# Patient Record
Sex: Male | Born: 1950 | Race: White | Hispanic: No | Smoking: Never smoker
Health system: Southern US, Community
[De-identification: ages and names within clinical notes are randomized; demographics above are authoritative.]

## PROBLEM LIST (undated history)

## (undated) DIAGNOSIS — M47812 Spondylosis without myelopathy or radiculopathy, cervical region: Secondary | ICD-10-CM

## (undated) DIAGNOSIS — N400 Enlarged prostate without lower urinary tract symptoms: Secondary | ICD-10-CM

## (undated) DIAGNOSIS — N529 Male erectile dysfunction, unspecified: Secondary | ICD-10-CM

## (undated) DIAGNOSIS — C801 Malignant (primary) neoplasm, unspecified: Secondary | ICD-10-CM

## (undated) HISTORY — PX: HERNIA REPAIR: SHX51

## (undated) HISTORY — PX: SKIN CANCER EXCISION: SHX779

## (undated) HISTORY — DX: Male erectile dysfunction, unspecified: N52.9

## (undated) HISTORY — DX: Spondylosis without myelopathy or radiculopathy, cervical region: M47.812

## (undated) HISTORY — DX: Benign prostatic hyperplasia without lower urinary tract symptoms: N40.0

---

## 2005-01-05 ENCOUNTER — Ambulatory Visit: Payer: Self-pay | Admitting: Gastroenterology

## 2005-01-05 LAB — HM COLONOSCOPY

## 2010-07-02 ENCOUNTER — Ambulatory Visit: Payer: Self-pay | Admitting: General Surgery

## 2010-07-07 ENCOUNTER — Ambulatory Visit: Payer: Self-pay | Admitting: General Surgery

## 2012-05-03 DIAGNOSIS — Z85828 Personal history of other malignant neoplasm of skin: Secondary | ICD-10-CM

## 2012-05-03 HISTORY — DX: Personal history of other malignant neoplasm of skin: Z85.828

## 2015-05-04 ENCOUNTER — Telehealth: Payer: Self-pay | Admitting: Family Medicine

## 2015-05-05 NOTE — Telephone Encounter (Signed)
Please let Aliou Yoon know that I'd like to see patient for an appointment here in the office for:  Follow-up He is is welcome to do the Welcome to Medicare visit (45 minutes) Please schedule a visit with me in the next: month Fasting?  Yes, please Thank you, Dr. Sanda Klein Rx being sent as requested

## 2015-05-06 ENCOUNTER — Telehealth: Payer: Self-pay | Admitting: Family Medicine

## 2015-05-06 NOTE — Telephone Encounter (Signed)
Patient said that per work he has to get his blood work done somewhere else, he needs a order to take or if Dr. Sanda Klein can send it to employee health at [facility] building. Patient said she has done it before, therefore he was not sure the exact information of the location.

## 2015-05-06 NOTE — Telephone Encounter (Signed)
I'll need to see him before ordering any labs please

## 2015-05-08 NOTE — Telephone Encounter (Signed)
Left vm on patient to call us back and make appointment and then you will send lab order to where he needs to do it.

## 2015-05-13 NOTE — Telephone Encounter (Signed)
Patient has an appt on 05/22/14 for his medicare well visit.

## 2015-05-20 DIAGNOSIS — N529 Male erectile dysfunction, unspecified: Secondary | ICD-10-CM | POA: Insufficient documentation

## 2015-05-20 DIAGNOSIS — N400 Enlarged prostate without lower urinary tract symptoms: Secondary | ICD-10-CM | POA: Insufficient documentation

## 2015-05-23 ENCOUNTER — Ambulatory Visit (INDEPENDENT_AMBULATORY_CARE_PROVIDER_SITE_OTHER): Payer: Managed Care, Other (non HMO) | Admitting: Family Medicine

## 2015-05-23 ENCOUNTER — Encounter: Payer: Self-pay | Admitting: Family Medicine

## 2015-05-23 VITALS — BP 113/72 | HR 58 | Temp 98.4°F | Ht 69.18 in | Wt 190.0 lb

## 2015-05-23 DIAGNOSIS — Z114 Encounter for screening for human immunodeficiency virus [HIV]: Secondary | ICD-10-CM

## 2015-05-23 DIAGNOSIS — Z0000 Encounter for general adult medical examination without abnormal findings: Secondary | ICD-10-CM | POA: Diagnosis not present

## 2015-05-23 DIAGNOSIS — Z1211 Encounter for screening for malignant neoplasm of colon: Secondary | ICD-10-CM | POA: Diagnosis not present

## 2015-05-23 DIAGNOSIS — Z1159 Encounter for screening for other viral diseases: Secondary | ICD-10-CM | POA: Diagnosis not present

## 2015-05-23 NOTE — Assessment & Plan Note (Signed)
One-time hep C screening test offered, ordered

## 2015-05-23 NOTE — Progress Notes (Signed)
Patient: Jeremiah Beck, Male    DOB: Jan 18, 1951, 65 y.o.   MRN: [id number]  Visit Date: 05/23/2015  Today's Provider: Enid Derry, MD   Chief Complaint  Patient presents with  . Welcome To Medicare    Subjective:   Syncere Katzenstein is a 65 y.o. male who presents today for his Subsequent Annual Wellness Visit.  Caregiver input:  No USPSTF grade A and B recommendations Alcohol: much less than 14/week, way less, almost nothing really Depression:  Depression screen [facility] 2/9 05/23/2015 05/23/2015  Decreased Interest 0 0  Down, Depressed, Hopeless 1 1  PHQ - 2 Score 1 1   Hypertension: excellent control Obesity: not obese Tobacco use: did not smoke much, less than 100 cigarettes HIV, hep B, hep C: agrees to hep C, no hep B, agrees to HIV STD testing and prevention (chl/gon/syphilis): asx Lipids: today Glucose: today Colorectal cancer: ordered Breast cancer: no lumps; no fam hx of breast cancer Lung cancer: n/a Osteoporosis: n/a AAA: less than 100 cigarettes Aspirin: taking 81 mg daily Diet: tries to eat fruits, try to eat healthy; encouraged healthy eating Exercise: he is active, working full-time, wants to be more active Skin cancer: sees dermatologist, goes yearly, Dr. Aubery Lapping  HPI  Review of Systems  Respiratory: Negative for shortness of breath.   Cardiovascular: Negative for chest pain.    Past Medical History  Diagnosis Date  . BPH (benign prostatic hypertrophy)   . ED (erectile dysfunction)    Past Surgical History  Procedure Laterality Date  . Hernia repair    . Skin cancer excision      removed from face   Family History  Problem Relation Age of Onset  . Cancer Father   . Alcohol abuse Father   . Varicose Veins Paternal Grandmother   . Cancer Mother   mother had bile duct cancer  Social History   Social History  . Marital Status: Divorced    Spouse Name: N/A  . Number of Children: N/A  . Years of Education: N/A   Occupational History  .  Not on file.   Social History Main Topics  . Smoking status: Never Smoker   . Smokeless tobacco: Never Used  . Alcohol Use: No  . Drug Use: No  . Sexual Activity: Not on file   Other Topics Concern  . Not on file   Social History Narrative    Outpatient Encounter Prescriptions as of 05/23/2015  Medication Sig  . aspirin EC 81 MG tablet Take 81 mg by mouth daily.  . finasteride (PROSCAR) 5 MG tablet take 1 tablet by mouth once daily   No facility-administered encounter medications on file as of 05/23/2015.   Functional Ability / Safety Screening 1.  Was the timed Get Up and Go test longer than 30 seconds?  no 2.  Does the patient need help with the phone, transportation, shopping,      preparing meals, housework, laundry, medications, or managing money?  no 3.  Does the patient's home have:  loose throw rugs in the hallway?   no      Grab bars in the bathroom? yes      Handrails on the stairs?   yes      Poor lighting?   no 4.  Has the patient noticed any hearing difficulties?   no  Fall Risk Assessment See under rooming  Depression Screen See under rooming Depression screen [facility] 2/9 05/23/2015 05/23/2015  Decreased Interest 0 0  Down, Depressed, Hopeless 1  1  PHQ - 2 Score 1 1   Advanced Directives Does patient have a HCPOA?    no Does patient have a living will or MOST form?  no  Objective:   Vitals: BP 113/72 mmHg  Pulse 58  Temp(Src) 98.4 F (36.9 C)  Ht 5' 9.17" (1.757 m)  Wt 190 lb (86.183 kg)  BMI 27.92 kg/m2  SpO2 94% Body mass index is 27.92 kg/(m^2).  Visual Acuity Screening   Right eye Left eye Both eyes  Without correction: 20/25 20/30 20/20   With correction:      Physical Exam  Constitutional: He appears well-developed and well-nourished. No distress.  HENT:  Head: Normocephalic and atraumatic.  Nose: Nose normal.  Mouth/Throat: Oropharynx is clear and moist.  Eyes: EOM are normal. No scleral icterus.  Neck: No JVD present. No thyromegaly  present.  Cardiovascular: Normal rate, regular rhythm and normal heart sounds.   Pulmonary/Chest: Effort normal and breath sounds normal. No respiratory distress. He has no wheezes. He has no rales.  Abdominal: Soft. Bowel sounds are normal. He exhibits no distension. There is no tenderness. There is no guarding.  Genitourinary: Rectal exam shows no mass, no tenderness and anal tone normal. Guaiac negative stool. Prostate is not enlarged and not tender.  Musculoskeletal: Normal range of motion. He exhibits no edema.  Lymphadenopathy:    He has no cervical adenopathy.  Neurological: He is alert. He displays normal reflexes. He exhibits normal muscle tone. Coordination normal.  Skin: Skin is warm and dry. No rash noted. He is not diaphoretic. No erythema. No pallor.  Psychiatric: He has a normal mood and affect. His behavior is normal. Judgment and thought content normal.   Mood/affect:  euthymic Appearance:  Casually dressed, [place] [employer] - 6[employer]  Correct? Score   What year is it? yes 0 Yes = 0    No = 4  What month is it? yes 0 Yes = 0    No = 3  Remember:     Pia [address]au, [place], [place]     What time is it? yes 0 Yes = 0    No = 3  Count backwards from 20 to 1 yes 0 Correct = 0    1 error = 2   More than 1 error = 4  Say the months of the year in reverse. yes 0 Correct = 0    1 error = 2   More than 1 error = 4  What address did I ask you to remember? yes 0 Correct = 0  1 error = 2    2 error = 4    3 error = 6    4 error = 8    All wrong = 10       TOTAL SCORE  0/28   Interpretation:  Normal  Normal (0-7) Abnormal (8-28)   Assessment & Plan:     Annual Wellness Visit  Reviewed patient's Family Medical History Reviewed and updated list of patient's medical providers Assessment of cognitive impairment was done Assessed patient's functional ability Established a written schedule for health screening [place]  Completed and Reviewed  Immunization History  Administered Date(s) Administered  . Td 01/01/2002, 05/03/2010   Health Maintenance  Topic Date Due  . Hepatitis C Screening  Jun 09, 1950  . HIV Screening  04/13/1965  . ZOSTAVAX  04/13/2010  . INFLUENZA VACCINE  12/02/2014  . COLONOSCOPY  01/06/2015  .  PNA vac Low Risk Adult (1 of 2 - PCV13) 04/14/2015  . TETANUS/TDAP  05/03/2020   Discussed health benefits of physical activity, and encouraged him to engage in regular exercise appropriate for his age and condition.    Current outpatient prescriptions:  .  aspirin EC 81 MG tablet, Take 81 mg by mouth daily., Disp: , Rfl:  .  finasteride (PROSCAR) 5 MG tablet, take 1 tablet by mouth once daily, Disp: 30 tablet, Rfl: 0 There are no discontinued medications.  Next Medicare Wellness Visit in 12+ months Orders Placed This Encounter  Procedures  . HIV antibody  . Hepatitis C antibody  . Lipid Panel w/o Chol/HDL Ratio  . Comprehensive metabolic panel  . PSA  . Ambulatory referral to Gastroenterology  . EKG 12-Lead  EKG: NSR, no abnormal ST-T wave changes, interpreted by MD  Problem List Items Addressed This Visit      Other   Preventative health care - Primary    USPSTF grade A and B recommendations reviewed with patient; age-appropriate recommendations, preventive care, screening tests, etc discussed and encouraged; healthy living encouraged; see AVS for patient education given to patient      Relevant Orders   Lipid Panel w/o Chol/HDL Ratio   Comprehensive metabolic panel   PSA   EKG 12-Lead (Completed)   Colon cancer screening    Refer to GI for screening colonoscopy      Relevant Orders   Ambulatory referral to Gastroenterology   Screening for HIV (human immunodeficiency virus)    One-time HIV screening test offered, ordered      Relevant Orders   HIV antibody   Need for hepatitis C screening test    One-time hep C screening test offered, ordered      Relevant  Orders   Hepatitis C antibody      He will have his labs done at outside facility; he was encouraged to call me if he has not heard back from his labs one week after having them drawn  An after-visit summary was printed and given to the patient at [facility].  Please see the patient instructions which may contain other information and recommendations beyond what is mentioned above in the assessment and plan.

## 2015-05-23 NOTE — Assessment & Plan Note (Signed)
One-time HIV screening test offered, ordered

## 2015-05-23 NOTE — Assessment & Plan Note (Signed)
USPSTF grade A and B recommendations reviewed with patient; age-appropriate recommendations, preventive care, screening tests, etc discussed and encouraged; healthy living encouraged; see AVS for patient education given to patient  

## 2015-05-23 NOTE — Patient Instructions (Addendum)
GINA--> please give living will, HCPOA paperwork We'll get you referred for a colonoscopy Please do have the labs done at your convenience Call if you have not heard from me one week after your labs drawn  Health Maintenance, Male A healthy lifestyle and preventative care can promote health and wellness.  Maintain regular health, dental, and eye exams.  Eat a healthy diet. Foods like vegetables, fruits, whole grains, low-fat dairy products, and lean protein foods contain the nutrients you need and are low in calories. Decrease your intake of foods high in solid fats, added sugars, and salt. Get information about a proper diet from your health care provider, if necessary.  Regular physical exercise is one of the most important things you can do for your health. Most adults should get at least 150 minutes of moderate-intensity exercise (any activity that increases your heart rate and causes you to sweat) each week. In addition, most adults need muscle-strengthening exercises on 2 or more days a week.   Maintain a healthy weight. The body mass index (BMI) is a screening tool to identify possible weight problems. It provides an estimate of body fat based on height and weight. Your health care provider can find your BMI and can help you achieve or maintain a healthy weight. For males 20 years and older:  A BMI below 18.5 is considered underweight.  A BMI of 18.5 to 24.9 is normal.  A BMI of 25 to 29.9 is considered overweight.  A BMI of 30 and above is considered obese.  Maintain normal blood lipids and cholesterol by exercising and minimizing your intake of saturated fat. Eat a balanced diet with plenty of fruits and vegetables. Blood tests for lipids and cholesterol should begin at age 39 and be repeated every 5 years. If your lipid or cholesterol levels are high, you are over age 40, or you are at high risk for heart disease, you may need your cholesterol levels checked more  frequently.Ongoing high lipid and cholesterol levels should be treated with medicines if diet and exercise are not working.  If you smoke, find out from your health care provider how to quit. If you do not use tobacco, do not start.  Lung cancer screening is recommended for adults aged 16-80 years who are at high risk for developing lung cancer because of a history of smoking. A yearly low-dose CT scan of the lungs is recommended for people who have at least a 30-pack-year history of smoking and are current smokers or have quit within the past 15 years. A pack year of smoking is smoking an average of 1 pack of cigarettes a day for 1 year (for example, a 30-pack-year history of smoking could mean smoking 1 pack a day for 30 years or 2 packs a day for 15 years). Yearly screening should continue until the smoker has stopped smoking for at least 15 years. Yearly screening should be stopped for people who develop a health problem that would prevent them from having lung cancer treatment.  If you choose to drink alcohol, do not have more than 2 drinks per day. One drink is considered to be 12 oz (360 mL) of beer, 5 oz (150 mL) of wine, or 1.5 oz (45 mL) of liquor.  Avoid the use of street drugs. Do not share needles with anyone. Ask for help if you need support or instructions about stopping the use of drugs.  High blood pressure causes heart disease and increases the risk of stroke. High  blood pressure is more likely to develop in:  People who have blood pressure in the end of the normal range (100-139/85-89 mm Hg).  People who are overweight or obese.  People who are African American.  If you are 28-7 years of age, have your blood pressure checked every 3-5 years. If you are 58 years of age or older, have your blood pressure checked every year. You should have your blood pressure measured twice--once when you are at a hospital or clinic, and once when you are not at a hospital or clinic. Record the  average of the two measurements. To check your blood pressure when you are not at a hospital or clinic, you can use:  An automated blood pressure machine at a pharmacy.  A home blood pressure monitor.  If you are 11-44 years old, ask your health care provider if you should take aspirin to prevent heart disease.  Diabetes screening involves taking a blood sample to check your fasting blood sugar level. This should be done once every 3 years after age 1 if you are at a normal weight and without risk factors for diabetes. Testing should be considered at a younger age or be carried out more frequently if you are overweight and have at least 1 risk factor for diabetes.  Colorectal cancer can be detected and often prevented. Most routine colorectal cancer screening begins at the age of 38 and continues through age 24. However, your health care provider may recommend screening at an earlier age if you have risk factors for colon cancer. On a yearly basis, your health care provider may provide home test kits to check for hidden blood in the stool. A small camera at the end of a tube may be used to directly examine the colon (sigmoidoscopy or colonoscopy) to detect the earliest forms of colorectal cancer. Talk to your health care provider about this at age 76 when routine screening begins. A direct exam of the colon should be repeated every 5-10 years through age 39, unless early forms of precancerous polyps or small growths are found.  People who are at an increased risk for hepatitis B should be screened for this virus. You are considered at high risk for hepatitis B if:  You were born in a country where hepatitis B occurs often. Talk with your health care provider about which countries are considered high risk.  Your parents were born in a high-risk country and you have not received a shot to protect against hepatitis B (hepatitis B vaccine).  You have HIV or AIDS.  You use needles to inject street  drugs.  You live with, or have sex with, someone who has hepatitis B.  You are a man who has sex with other men (MSM).  You get hemodialysis treatment.  You take certain medicines for conditions like cancer, organ transplantation, and autoimmune conditions.  Hepatitis C blood testing is recommended for all people born from [birth year] through 1966 and any individual with known risk factors for hepatitis C.  Healthy men should no longer receive prostate-specific antigen (PSA) blood tests as part of routine cancer screening. Talk to your health care provider about prostate cancer screening.  Testicular cancer screening is not recommended for adolescents or adult males who have no symptoms. Screening includes self-exam, a health care provider exam, and other screening tests. Consult with your health care provider about any symptoms you have or any concerns you have about testicular cancer.  Practice safe sex. Use condoms and  avoid high-risk sexual practices to reduce the spread of sexually transmitted infections (STIs).  You should be screened for STIs, including gonorrhea and chlamydia if:  You are sexually active and are younger than 24 years.  You are older than 24 years, and your health care provider tells you that you are at risk for this type of infection.  Your sexual activity has changed since you were last screened, and you are at an increased risk for chlamydia or gonorrhea. Ask your health care provider if you are at risk.  If you are at risk of being infected with HIV, it is recommended that you take a prescription medicine daily to prevent HIV infection. This is called pre-exposure prophylaxis (PrEP). You are considered at risk if:  You are a man who has sex with other men (MSM).  You are a heterosexual man who is sexually active with multiple partners.  You take drugs by injection.  You are sexually active with a partner who has HIV.  Talk with your health care provider about  whether you are at high risk of being infected with HIV. If you choose to begin PrEP, you should first be tested for HIV. You should then be tested every 3 months for as long as you are taking PrEP.  Use sunscreen. Apply sunscreen liberally and repeatedly throughout the day. You should seek shade when your shadow is shorter than you. Protect yourself by wearing long sleeves, pants, a wide-brimmed hat, and sunglasses year round whenever you are outdoors.  Tell your health care provider of new moles or changes in moles, especially if there is a change in shape or color. Also, tell your health care provider if a mole is larger than the size of a pencil eraser.  A one-time screening for abdominal aortic aneurysm (AAA) and surgical repair of large AAAs by ultrasound is recommended for men aged 63-75 years who are current or former smokers.  Stay current with your vaccines (immunizations).   This information is not intended to replace advice given to you by your health care provider. Make sure you discuss any questions you have with your health care provider.   Document Released: 10/16/2007 Document Revised: 05/10/2014 Document Reviewed: 09/14/2010 Elsevier Interactive Patient Education [employer].

## 2015-05-23 NOTE — Assessment & Plan Note (Signed)
Refer to GI for screening colonoscopy 

## 2015-06-07 ENCOUNTER — Other Ambulatory Visit: Payer: Self-pay | Admitting: Family Medicine

## 2015-06-08 NOTE — Telephone Encounter (Signed)
rx approved

## 2015-07-22 ENCOUNTER — Telehealth: Payer: Self-pay | Admitting: Family Medicine

## 2015-07-22 DIAGNOSIS — Z1211 Encounter for screening for malignant neoplasm of colon: Secondary | ICD-10-CM

## 2015-07-22 NOTE — Telephone Encounter (Signed)
Please call patient; I have not seen the results of his labs ordered in January I will only be here at [facility] another ten days If he can please get those drawn and sent over before I leave, that would be greatly appreciated Thank you

## 2015-07-23 NOTE — Assessment & Plan Note (Addendum)
Cologuard ordered at patient's request; he does not want colonoscopy

## 2015-07-23 NOTE — Telephone Encounter (Signed)
Reminded patient to have his labs drawn thru his work clinic asap. He'd also rather have the cologuard test done instead of the colonoscopy. Can you put in the order for that?

## 2015-07-23 NOTE — Telephone Encounter (Signed)
Thank you I put in the order for Cologuard Please have someone CANCEL the gastroenterology referral for colonoscopy

## 2015-07-23 NOTE — Telephone Encounter (Signed)
GI referral cancelled

## 2016-05-25 ENCOUNTER — Encounter: Payer: Managed Care, Other (non HMO) | Admitting: Family Medicine

## 2016-06-04 ENCOUNTER — Encounter: Payer: Managed Care, Other (non HMO) | Admitting: Family Medicine

## 2016-06-09 ENCOUNTER — Encounter: Payer: Managed Care, Other (non HMO) | Admitting: Family Medicine

## 2016-06-21 ENCOUNTER — Other Ambulatory Visit: Payer: Self-pay

## 2016-06-21 ENCOUNTER — Other Ambulatory Visit: Payer: Self-pay | Admitting: Family Medicine

## 2016-06-21 NOTE — Telephone Encounter (Signed)
Patient needs labs and an appt Never had the labs done that were ordered January 2017

## 2016-06-21 NOTE — Telephone Encounter (Signed)
Note is completely blank

## 2016-06-23 ENCOUNTER — Ambulatory Visit (INDEPENDENT_AMBULATORY_CARE_PROVIDER_SITE_OTHER): Payer: Managed Care, Other (non HMO) | Admitting: Family Medicine

## 2016-06-23 ENCOUNTER — Encounter: Payer: Self-pay | Admitting: Family Medicine

## 2016-06-23 VITALS — BP 114/70 | HR 70 | Temp 99.0°F | Resp 16 | Ht 69.5 in | Wt 190.0 lb

## 2016-06-23 DIAGNOSIS — Z114 Encounter for screening for human immunodeficiency virus [HIV]: Secondary | ICD-10-CM | POA: Diagnosis not present

## 2016-06-23 DIAGNOSIS — Z125 Encounter for screening for malignant neoplasm of prostate: Secondary | ICD-10-CM

## 2016-06-23 DIAGNOSIS — Z113 Encounter for screening for infections with a predominantly sexual mode of transmission: Secondary | ICD-10-CM | POA: Diagnosis not present

## 2016-06-23 DIAGNOSIS — Z0000 Encounter for general adult medical examination without abnormal findings: Secondary | ICD-10-CM | POA: Diagnosis not present

## 2016-06-23 DIAGNOSIS — Z1159 Encounter for screening for other viral diseases: Secondary | ICD-10-CM

## 2016-06-23 DIAGNOSIS — Z1211 Encounter for screening for malignant neoplasm of colon: Secondary | ICD-10-CM | POA: Diagnosis not present

## 2016-06-23 LAB — HEMOCCULT GUIAC POC 1CARD (OFFICE)
Card #1 Date: 2212018
Fecal Occult Blood, POC: NEGATIVE

## 2016-06-23 MED ORDER — FINASTERIDE 5 MG PO TABS: 5.0000 mg | ORAL_TABLET | Freq: Every day | ORAL | 11 refills | Status: DC

## 2016-06-23 NOTE — Assessment & Plan Note (Signed)
Re-order cologuard; continue baby aspirin daily

## 2016-06-23 NOTE — Assessment & Plan Note (Signed)
Offered STD screening

## 2016-06-23 NOTE — Patient Instructions (Addendum)
Please have labs done (fasting) next week Health Maintenance, Male A healthy lifestyle and preventative care can promote health and wellness.  Maintain regular health, dental, and eye exams.  Eat a healthy diet. Foods like vegetables, fruits, whole grains, low-fat dairy products, and lean protein foods contain the nutrients you need and are low in calories. Decrease your intake of foods high in solid fats, added sugars, and salt. Get information about a proper diet from your health care provider, if necessary.  Regular physical exercise is one of the most important things you can do for your health. Most adults should get at least 150 minutes of moderate-intensity exercise (any activity that increases your heart rate and causes you to sweat) each week. In addition, most adults need muscle-strengthening exercises on 2 or more days a week.   Maintain a healthy weight. The body mass index (BMI) is a screening tool to identify possible weight problems. It provides an estimate of body fat based on height and weight. Your health care provider can find your BMI and can help you achieve or maintain a healthy weight. For males 20 years and older:  A BMI below 18.5 is considered underweight.  A BMI of 18.5 to 24.9 is normal.  A BMI of 25 to 29.9 is considered overweight.  A BMI of 30 and above is considered obese.  Maintain normal blood lipids and cholesterol by exercising and minimizing your intake of saturated fat. Eat a balanced diet with plenty of fruits and vegetables. Blood tests for lipids and cholesterol should begin at age 45 and be repeated every 5 years. If your lipid or cholesterol levels are high, you are over age 26, or you are at high risk for heart disease, you may need your cholesterol levels checked more frequently.Ongoing high lipid and cholesterol levels should be treated with medicines if diet and exercise are not working.  If you smoke, find out from your health care provider how  to quit. If you do not use tobacco, do not start.  Lung cancer screening is recommended for adults aged 52-80 years who are at high risk for developing lung cancer because of a history of smoking. A yearly low-dose CT scan of the lungs is recommended for people who have at least a 30-pack-year history of smoking and are current smokers or have quit within the past 15 years. A pack year of smoking is smoking an average of 1 pack of cigarettes a day for 1 year (for example, a 30-pack-year history of smoking could mean smoking 1 pack a day for 30 years or 2 packs a day for 15 years). Yearly screening should continue until the smoker has stopped smoking for at least 15 years. Yearly screening should be stopped for people who develop a health problem that would prevent them from having lung cancer treatment.  If you choose to drink alcohol, do not have more than 2 drinks per day. One drink is considered to be 12 oz (360 mL) of beer, 5 oz (150 mL) of wine, or 1.5 oz (45 mL) of liquor.  Avoid the use of street drugs. Do not share needles with anyone. Ask for help if you need support or instructions about stopping the use of drugs.  High blood pressure causes heart disease and increases the risk of stroke. High blood pressure is more likely to develop in:  People who have blood pressure in the end of the normal range (100-139/85-89 mm Hg).  People who are overweight or obese.  People who are African American.  If you are 39-33 years of age, have your blood pressure checked every 3-5 years. If you are 82 years of age or older, have your blood pressure checked every year. You should have your blood pressure measured twice-once when you are at a hospital or clinic, and once when you are not at a hospital or clinic. Record the average of the two measurements. To check your blood pressure when you are not at a hospital or clinic, you can use:  An automated blood pressure machine at a pharmacy.  A home blood  pressure monitor.  If you are 83-35 years old, ask your health care provider if you should take aspirin to prevent heart disease.  Diabetes screening involves taking a blood sample to check your fasting blood sugar level. This should be done once every 3 years after age 77 if you are at a normal weight and without risk factors for diabetes. Testing should be considered at a younger age or be carried out more frequently if you are overweight and have at least 1 risk factor for diabetes.  Colorectal cancer can be detected and often prevented. Most routine colorectal cancer screening begins at the age of 7 and continues through age 74. However, your health care provider may recommend screening at an earlier age if you have risk factors for colon cancer. On a yearly basis, your health care provider may provide home test kits to check for hidden blood in the stool. A small camera at the end of a tube may be used to directly examine the colon (sigmoidoscopy or colonoscopy) to detect the earliest forms of colorectal cancer. Talk to your health care provider about this at age 28 when routine screening begins. A direct exam of the colon should be repeated every 5-10 years through age 50, unless early forms of precancerous polyps or small growths are found.  People who are at an increased risk for hepatitis B should be screened for this virus. You are considered at high risk for hepatitis B if:  You were born in a country where hepatitis B occurs often. Talk with your health care provider about which countries are considered high risk.  Your parents were born in a high-risk country and you have not received a shot to protect against hepatitis B (hepatitis B vaccine).  You have HIV or AIDS.  You use needles to inject street drugs.  You live with, or have sex with, someone who has hepatitis B.  You are a man who has sex with other men (MSM).  You get hemodialysis treatment.  You take certain medicines  for conditions like cancer, organ transplantation, and autoimmune conditions.  Hepatitis C blood testing is recommended for all people born from [birth year] through 1965 and any individual with known risk factors for hepatitis C.  Healthy men should no longer receive prostate-specific antigen (PSA) blood tests as part of routine cancer screening. Talk to your health care provider about prostate cancer screening.  Testicular cancer screening is not recommended for adolescents or adult males who have no symptoms. Screening includes self-exam, a health care provider exam, and other screening tests. Consult with your health care provider about any symptoms you have or any concerns you have about testicular cancer.  Practice safe sex. Use condoms and avoid high-risk sexual practices to reduce the spread of sexually transmitted infections (STIs).  You should be screened for STIs, including gonorrhea and chlamydia if:  You are sexually active and are  younger than 24 years.  You are older than 24 years, and your health care provider tells you that you are at risk for this type of infection.  Your sexual activity has changed since you were last screened, and you are at an increased risk for chlamydia or gonorrhea. Ask your health care provider if you are at risk.  If you are at risk of being infected with HIV, it is recommended that you take a prescription medicine daily to prevent HIV infection. This is called pre-exposure prophylaxis (PrEP). You are considered at risk if:  You are a man who has sex with other men (MSM).  You are a heterosexual man who is sexually active with multiple partners.  You take drugs by injection.  You are sexually active with a partner who has HIV.  Talk with your health care provider about whether you are at high risk of being infected with HIV. If you choose to begin PrEP, you should first be tested for HIV. You should then be tested every 3 months for as long as you are  taking PrEP.  Use sunscreen. Apply sunscreen liberally and repeatedly throughout the day. You should seek shade when your shadow is shorter than you. Protect yourself by wearing long sleeves, pants, a wide-brimmed hat, and sunglasses year round whenever you are outdoors.  Tell your health care provider of new moles or changes in moles, especially if there is a change in shape or color. Also, tell your health care provider if a mole is larger than the size of a pencil eraser.  A one-time screening for abdominal aortic aneurysm (AAA) and surgical repair of large AAAs by ultrasound is recommended for men aged 54-75 years who are current or former smokers.  Stay current with your vaccines (immunizations). This information is not intended to replace advice given to you by your health care provider. Make sure you discuss any questions you have with your health care provider. Document Released: 10/16/2007 Document Revised: 05/10/2014 Document Reviewed: 01/21/2015 Elsevier Interactive Patient Education  2017 [employer].

## 2016-06-23 NOTE — Assessment & Plan Note (Signed)
Check PSA in 48 hours after DRE, patient will have this done at the hospital lab

## 2016-06-23 NOTE — Assessment & Plan Note (Signed)
USPSTF grade A and B recommendations reviewed with patient; age-appropriate recommendations, preventive care, screening tests, etc discussed and encouraged; healthy living encouraged; see AVS for patient education given to patient  

## 2016-06-23 NOTE — Assessment & Plan Note (Signed)
order

## 2016-06-23 NOTE — Assessment & Plan Note (Signed)
Discussed one-time hep C screening recommendation for individuals born between 1945-1965 per USPSTF guidelines; patient agrees with testing; Hep C Ab ordered 

## 2016-06-23 NOTE — Progress Notes (Signed)
   BP 114/70   Pulse 70   Temp 99 F (37.2 C) (Oral)   Resp 16   Ht 5' 9.5" (1.765 m)   Wt 190 lb (86.2 kg)   SpO2 94%   BMI 27.66 kg/m    Subjective:    Patient ID: Jeremiah Beck, male    DOB: August 26, 1950, 66 y.o.   MRN: [id number]  HPI: Nate Berrocal is a 66 y.o. male  Chief Complaint  Patient presents with  . Annual Exam     Depression screen [facility] 2/9 06/23/2016 05/23/2015 05/23/2015  Decreased Interest 0 0 0  Down, Depressed, Hopeless 0 1 1  PHQ - 2 Score 0 1 1    No flowsheet data found.  Relevant past medical, surgical, family and social history reviewed Past Medical History:  Diagnosis Date  . BPH (benign prostatic hypertrophy)   . ED (erectile dysfunction)    Past Surgical History:  Procedure Laterality Date  . HERNIA REPAIR    . SKIN CANCER EXCISION     removed from face   Family History  Problem Relation Age of Onset  . Cancer Father   . Alcohol abuse Father   . Varicose Veins Paternal Grandmother   . Cancer Mother    Social History  Substance Use Topics  . Smoking status: Never Smoker  . Smokeless tobacco: Never Used  . Alcohol use No    Interim medical history since last visit reviewed. Allergies and medications reviewed  Review of Systems Per HPI unless specifically indicated above     Objective:    BP 114/70   Pulse 70   Temp 99 F (37.2 C) (Oral)   Resp 16   Ht 5' 9.5" (1.765 m)   Wt 190 lb (86.2 kg)   SpO2 94%   BMI 27.66 kg/m   Wt Readings from Last 3 Encounters:  06/23/16 190 lb (86.2 kg)  05/23/15 190 lb (86.2 kg)  02/27/14 191 lb (86.6 kg)    Physical Exam  Results for orders placed or performed in visit on 05/20/15  HM COLONOSCOPY  Result Value Ref Range   HM Colonoscopy per PP       Assessment & Plan:   Problem List Items Addressed This Visit    None       Follow up plan: No Follow-up on file.  An after-visit summary was printed and given to the patient at [facility].  Please see the patient  instructions which may contain other information and recommendations beyond what is mentioned above in the assessment and plan.  No orders of the defined types were placed in this encounter.   No orders of the defined types were placed in this encounter.

## 2016-06-23 NOTE — Progress Notes (Signed)
Patient ID: Jeremiah Beck, male   DOB: [birth year], 66 y.o.   MRN: [id number]   Subjective:   Jeremiah Beck is a 65 y.o. male here for a complete physical exam  Interim issues since last visit: no medical excitement since last visit  USPSTF grade A and B recommendations Depression:  Depression screen [facility] 2/9 06/23/2016 05/23/2015 05/23/2015  Decreased Interest 0 0 0  Down, Depressed, Hopeless 0 1 1  PHQ - 2 Score 0 1 1   Hypertension: no Obesity: no Alcohol: sporadic Tobacco use: never per patient HIV, hep B, hep C: ordered today STD testing and prevention (chl/gon/syphilis): will check Lipids: check fasting next week Glucose: check fasting next Colorectal cancer: ignored the cologuard for some reason he said; he set it aside; he just procrastinated; going forward, he'd like to do Cologuard Prostate cancer: has BPH; on finasteride; urinating is fine; will get PSA today Breast cancer: no lumps Lung cancer: never smoker Osteoporosis: no steroids; not much steroids AAA: no fam hx; probably not even 100 cigs in lifetime as a teenager Aspirin: taking daily Diet: eats fruits and veggies; apple for breakfast this morning; not much junk; subs for lunch with veggies; snacks on almonds, different fruits Exercise: active at work Skin cancer: patient sees dermatologist, 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 Aubery Lapping; sees her yearly   Past Medical History:  Diagnosis Date  . BPH (benign prostatic hypertrophy)   . ED (erectile dysfunction)    Past Surgical History:  Procedure Laterality Date  . HERNIA REPAIR    . SKIN CANCER EXCISION     removed from face   Family History  Problem Relation Age of Onset  . Cancer Father   . Alcohol abuse Father   . Varicose Veins Paternal Grandmother   . Cancer Mother    Social History  Substance Use Topics  . Smoking status: Never Smoker  . Smokeless tobacco: Never Used  . Alcohol use No   Review of Systems  Objective:    Vitals:   06/23/16 1114  BP: 114/70  Pulse: 70  Resp: 16  Temp: 99 F (37.2 C)  TempSrc: Oral  SpO2: 94%  Weight: 190 lb (86.2 kg)  Height: 5' 9.5" (1.765 m)   Body mass index is 27.66 kg/m. Wt Readings from Last 3 Encounters:  06/23/16 190 lb (86.2 kg)  05/23/15 190 lb (86.2 kg)  02/27/14 191 lb (86.6 kg)   Physical Exam  Constitutional: He appears well-developed and well-nourished. No distress.  HENT:  Head: Normocephalic and atraumatic.  Nose: Nose normal.  Mouth/Throat: Oropharynx is clear and moist.  Eyes: EOM are normal. No scleral icterus.  Neck: No JVD present. No thyromegaly present.  Cardiovascular: Normal rate, regular rhythm and normal heart sounds.   Pulmonary/Chest: Effort normal and breath sounds normal. No respiratory distress. He has no wheezes. He has no rales.  Abdominal: Soft. Bowel sounds are normal. He exhibits no distension. There is no tenderness. There is no guarding.  Genitourinary: Prostate is not enlarged and not tender.  Genitourinary Comments: No prostate nodules  Musculoskeletal: Normal range of motion. He exhibits no edema.  Lymphadenopathy:    He has no cervical adenopathy.  Neurological: He is alert. He displays normal reflexes. He exhibits normal muscle tone. Coordination normal.  Skin: Skin is warm and dry. No rash noted. He is not diaphoretic. No erythema. No pallor.  Psychiatric: He has a normal mood and affect. His behavior is normal. Judgment and thought content normal.  Assessment/Plan:   Problem List Items Addressed This Visit      Other   Screening for HIV (human immunodeficiency virus)    order      Relevant Orders   HIV antibody   Screen for STD (sexually transmitted disease)    Offered STD screening      Relevant Orders   RPR   GC/Chlamydia Probe Amp   Prostate cancer screening    Check PSA in 48 hours after DRE, patient will have this done at the hospital lab      Relevant Orders   PSA   Preventative  health care - Primary    USPSTF grade A and B recommendations reviewed with patient; age-appropriate recommendations, preventive care, screening tests, etc discussed and encouraged; healthy living encouraged; see AVS for patient education given to patient      Relevant Orders   COMPLETE METABOLIC PANEL WITH GFR   Lipid panel   CBC with Differential/Platelet   Need for hepatitis C screening test    Discussed one-time hep C screening recommendation for individuals born between 1945-1965 per USPSTF guidelines; patient agrees with testing; Hep C Ab ordered       Relevant Orders   Hepatitis C Antibody   Colon cancer screening    Re-order cologuard; continue baby aspirin daily      Relevant Orders   Cologuard   POCT occult blood stool (Completed)    Other Visit Diagnoses    Encounter for Hemoccult screening       Relevant Orders   POCT occult blood stool (Completed)      Meds ordered this encounter  Medications  . finasteride (PROSCAR) 5 MG tablet    Sig: Take 1 tablet (5 mg total) by mouth daily.    Dispense:  30 tablet    Refill:  11   Orders Placed This Encounter  Procedures  . GC/Chlamydia Probe Amp  . Cologuard  . COMPLETE METABOLIC PANEL WITH GFR  . PSA  . Lipid panel  . CBC with Differential/Platelet  . RPR  . HIV antibody  . Hepatitis C Antibody  . POCT occult blood stool    Follow up plan: Return in about 1 year (around 06/23/2017) for complete physical OR Medicare Wellness visit.  An After Visit Summary was printed and given to the patient.

## 2016-07-17 LAB — COLOGUARD: Cologuard: NEGATIVE

## 2017-01-24 ENCOUNTER — Ambulatory Visit: Payer: Self-pay | Admitting: Physician Assistant

## 2017-01-24 ENCOUNTER — Encounter: Payer: Self-pay | Admitting: Physician Assistant

## 2017-01-24 VITALS — BP 130/70 | HR 66 | Temp 98.4°F | Resp 16

## 2017-01-24 DIAGNOSIS — M62838 Other muscle spasm: Secondary | ICD-10-CM

## 2017-01-24 MED ORDER — BACLOFEN 10 MG PO TABS: 10.0000 mg | ORAL_TABLET | Freq: Three times a day (TID) | ORAL | 0 refills | Status: DC

## 2017-01-24 NOTE — Progress Notes (Signed)
S: c/o neck pain and spasms, states he slept in a chair for 2 nights and now his neck and shoulders hurt, decreased rom, states he can't look up or back, no numbness or tingling in arms/hands, no cp/sob  O: vitals wnl, nad, lungs c t a, cv rrr, cspine is not tender, pt has decreased rom in all planes, worse with hyperextension, grips =b/l, tender muscles at scm and trapezious  A: neck spasms  P: baclofen, otc ibuprofen wet heat, ?chiropractor

## 2017-02-22 ENCOUNTER — Ambulatory Visit (INDEPENDENT_AMBULATORY_CARE_PROVIDER_SITE_OTHER): Payer: Managed Care, Other (non HMO) | Admitting: Family Medicine

## 2017-02-22 ENCOUNTER — Ambulatory Visit
Admission: RE | Admit: 2017-02-22 | Discharge: 2017-02-22 | Disposition: A | Discharge status: Home / Self Care | Payer: Managed Care, Other (non HMO) | Source: Ambulatory Visit | Attending: Family Medicine | Admitting: Family Medicine

## 2017-02-22 ENCOUNTER — Encounter: Payer: Self-pay | Admitting: Family Medicine

## 2017-02-22 VITALS — BP 98/64 | HR 88 | Temp 98.0°F | Ht 69.5 in | Wt 200.3 lb

## 2017-02-22 DIAGNOSIS — Z1159 Encounter for screening for other viral diseases: Secondary | ICD-10-CM

## 2017-02-22 DIAGNOSIS — M542 Cervicalgia: Secondary | ICD-10-CM | POA: Diagnosis present

## 2017-02-22 DIAGNOSIS — Z23 Encounter for immunization: Secondary | ICD-10-CM | POA: Diagnosis not present

## 2017-02-22 DIAGNOSIS — M1388 Other specified arthritis, other site: Secondary | ICD-10-CM | POA: Insufficient documentation

## 2017-02-22 DIAGNOSIS — Z114 Encounter for screening for human immunodeficiency virus [HIV]: Secondary | ICD-10-CM

## 2017-02-22 DIAGNOSIS — Z113 Encounter for screening for infections with a predominantly sexual mode of transmission: Secondary | ICD-10-CM | POA: Diagnosis not present

## 2017-02-22 DIAGNOSIS — Z125 Encounter for screening for malignant neoplasm of prostate: Secondary | ICD-10-CM

## 2017-02-22 DIAGNOSIS — C44300 Unspecified malignant neoplasm of skin of unspecified part of face: Secondary | ICD-10-CM | POA: Diagnosis not present

## 2017-02-22 DIAGNOSIS — Z0000 Encounter for general adult medical examination without abnormal findings: Secondary | ICD-10-CM

## 2017-02-22 NOTE — Patient Instructions (Addendum)
Please have xrays done across the street Try turmeric as a natural anti-inflammatory (for pain and arthritis). It comes in capsules where you buy aspirin and fish oil, but also as a spice where you buy pepper and garlic powder. You have received the Prevnar vaccine (PCV-13) and you will not need another booster of this for the rest of your life per current ACIP guidelines You received the flu shot today; it should protect you against the flu virus over the coming months; it will take about two weeks for antibodies to develop; do try to stay away from hospitals, nursing homes, and daycares during peak flu season; taking extra vitamin C daily during flu season may help you avoid getting sick We'll have you see Dr. Aundria Mems

## 2017-02-22 NOTE — Progress Notes (Signed)
BP 98/64 (BP Location: Left Arm, Patient Position: Sitting, Cuff Size: Large)   Pulse 88   Temp 98 F (36.7 C) (Oral)   Ht 5' 9.5" (1.765 m)   Wt 200 lb 4.8 oz (90.9 kg)   BMI 29.16 kg/m    Subjective:    Patient ID: Jeremiah Beck, male    DOB: Sep 05, 1950, 66 y.o.   MRN: [id number]  HPI: Jeremiah Beck is a 66 y.o. male  Chief Complaint  Patient presents with  . Neck Pain    Sore neck, seen by employee health, was given muscle relaxants but still feels as though it is not 100%  . Rash    Pt states that he cut his self shaving and problem has not resolved   . Immunizations    to discuss flu vaccine    HPI Patient is here for a few things He has neck pain; was already seen at employee health Tried muscle relaxers but not back to previous level of activity/health Got a travel pillow for support He remembers a time when his neck went back, slept in a chair for two nights per the employee health note Hurt across the shoulders like crazy; neck stiff looking left and right; looking up, "my range of motion was pitiful" Missed 2-3 days at work They recommended chiropractor Going on for a few weeks total he says He has always had a little bit of tightness for years, and would have occasional bouts He thinks arthitis No xrays done No numbness or tingling down the arms; no weakness in the UE  He has a rash He cut himself shaving and still bothered by the area; now with a bump on the left side of his cheek He has already had a skin cancer removed by dermatologist on the left side of his face once  Immunizations reviewed; discussed flu and pneumonia vaccines He never got his labs done from February  Depression screen [facility] 2/9 02/22/2017 06/23/2016 05/23/2015 05/23/2015  Decreased Interest 0 0 0 0  Down, Depressed, Hopeless 0 0 1 1  PHQ - 2 Score 0 0 1 1    Relevant past medical, surgical, family and social history reviewed Past Medical History:  Diagnosis Date  . BPH  (benign prostatic hypertrophy)   . ED (erectile dysfunction)    Past Surgical History:  Procedure Laterality Date  . HERNIA REPAIR    . SKIN CANCER EXCISION     removed from face   Family History  Problem Relation Age of Onset  . Cancer Father   . Alcohol abuse Father   . Cancer Mother   . Varicose Veins Paternal Grandmother    Social History   Social History  . Marital status: Divorced    Spouse name: N/A  . Number of children: N/A  . Years of education: N/A   Occupational History  . Not on file.   Social History Main Topics  . Smoking status: Never Smoker  . Smokeless tobacco: Never Used  . Alcohol use No  . Drug use: No  . Sexual activity: Not on file   Other Topics Concern  . Not on file   Social History Narrative  . No narrative on file    Interim medical history since last visit reviewed. Allergies and medications reviewed  Review of Systems Per HPI unless specifically indicated above     Objective:    BP 98/64 (BP Location: Left Arm, Patient Position: Sitting, Cuff Size: Large)   Pulse 88  Temp 98 F (36.7 C) (Oral)   Ht 5' 9.5" (1.765 m)   Wt 200 lb 4.8 oz (90.9 kg)   BMI 29.16 kg/m   Wt Readings from Last 3 Encounters:  02/22/17 200 lb 4.8 oz (90.9 kg)  06/23/16 190 lb (86.2 kg)  05/23/15 190 lb (86.2 kg)    Physical Exam  Constitutional: He appears well-developed and well-nourished. No distress.  Eyes: No scleral icterus.  Neck: Muscular tenderness (mild) present. No spinous process tenderness present. Decreased range of motion present. No erythema present.  Cardiovascular: Normal rate and regular rhythm.   Pulmonary/Chest: Effort normal and breath sounds normal.  Musculoskeletal:       Cervical back: He exhibits decreased range of motion and tenderness. He exhibits no bony tenderness, no swelling, no edema, no deformity and no spasm.  Neurological: He is alert.  Skin: Lesion noted. No pallor.  Domed erythematous papule on the far  lateral LEFT cheek; measures 12x12 mm; central scabbing and umbilication; marked telangiectasia noted  Psychiatric: He has a normal mood and affect. His mood appears not anxious.    Results for orders placed or performed in visit on 06/23/16  Cologuard  Result Value Ref Range   Cologuard Negative   POCT occult blood stool  Result Value Ref Range   Fecal Occult Blood, POC Negative Negative   Card #1 Date [id number]    Card #2 Fecal Occult Blod, POC     Card #2 Date     Card #3 Fecal Occult Blood, POC     Card #3 Date        Assessment & Plan:   Problem List Items Addressed This Visit      Other   Screen for STD (sexually transmitted disease)   Prostate cancer screening   Relevant Orders   PSA   Preventative health care   Relevant Orders   Comprehensive Metabolic Panel (CMET)   RPR   Lipid panel   Need for hepatitis C screening test   Relevant Orders   Hepatitis C antibody   Cervical spine pain - Primary    Will order xrays and PT; Ts (tylenol, topicals, temperature, etc); limit tylenol to no more than 3000 mg per day; automatic cut off and low heat      Relevant Orders   DG Cervical Spine Complete   Ambulatory referral to Physical Therapy    Other Visit Diagnoses    Malignant neoplasm skin of face       Relevant Orders   Ambulatory referral to Dermatology   Flu vaccine need       Relevant Orders   Flu vaccine HIGH DOSE PF (Fluzone High dose) (Completed)   Need for vaccination against Streptococcus pneumoniae       give PCV-13 today and PPSV-23 in one year   Relevant Orders   Pneumococcal conjugate vaccine 13-valent IM (Completed)   Encounter for screening for HIV       Relevant Orders   HIV antibody       Follow up plan: No Follow-up on file.  An after-visit summary was printed and given to the patient at [place].  Please see the patient instructions which may contain other information and recommendations beyond what is mentioned above in the assessment  and plan.  No orders of the defined types were placed in this encounter.   Orders Placed This Encounter  Procedures  . DG Cervical Spine Complete  . Flu vaccine HIGH DOSE PF (Fluzone High dose)  .  Pneumococcal conjugate vaccine 13-valent IM  . Hepatitis C antibody  . HIV antibody  . Comprehensive Metabolic Panel (CMET)  . RPR  . Lipid panel  . PSA  . Ambulatory referral to Dermatology  . Ambulatory referral to Physical Therapy

## 2017-02-22 NOTE — Assessment & Plan Note (Signed)
Will order xrays and PT; Ts (tylenol, topicals, temperature, etc); limit tylenol to no more than 3000 mg per day; automatic cut off and low heat

## 2017-02-23 ENCOUNTER — Telehealth: Payer: Self-pay

## 2017-02-23 ENCOUNTER — Other Ambulatory Visit: Payer: Self-pay

## 2017-02-23 ENCOUNTER — Encounter: Payer: Self-pay | Admitting: Family Medicine

## 2017-02-23 DIAGNOSIS — Z0000 Encounter for general adult medical examination without abnormal findings: Secondary | ICD-10-CM

## 2017-02-23 DIAGNOSIS — M47812 Spondylosis without myelopathy or radiculopathy, cervical region: Secondary | ICD-10-CM

## 2017-02-23 DIAGNOSIS — Z113 Encounter for screening for infections with a predominantly sexual mode of transmission: Secondary | ICD-10-CM

## 2017-02-23 HISTORY — DX: Spondylosis without myelopathy or radiculopathy, cervical region: M47.812

## 2017-02-23 NOTE — Telephone Encounter (Signed)
Called pt informed him of the need for additional blood work, advised pt to come and pick up orders to take to employee clinic. Pt gave verbal understanding states he will come by this week. Orders placed up front.

## 2017-02-28 ENCOUNTER — Telehealth: Payer: Self-pay | Admitting: Family Medicine

## 2017-02-28 NOTE — Telephone Encounter (Signed)
Please look at lab orders

## 2017-03-01 NOTE — Telephone Encounter (Signed)
Please check on the outstanding / canceled lab orders I'd like to get those done please Thank you

## 2017-03-01 NOTE — Telephone Encounter (Signed)
Called pt he states that he has not had time to go get labs drawn. Plans to go this Friday 03/04/2017. The pt plans to go to the employee clinic which utilizes [employer] so they should be able to see orders, but just to cover my bases I told the pt to come by the office and pick up the lab requisitions. Pt gave verbal understanding. Thanks!

## 2017-04-29 ENCOUNTER — Ambulatory Visit: Payer: Self-pay | Admitting: Nurse Practitioner

## 2017-04-29 VITALS — BP 120/70 | HR 95 | Temp 98.5°F | Resp 16

## 2017-04-29 DIAGNOSIS — J019 Acute sinusitis, unspecified: Secondary | ICD-10-CM

## 2017-04-29 MED ORDER — FLUTICASONE PROPIONATE 50 MCG/ACT NA SUSP: 2.0000 | Freq: Every day | NASAL | 0 refills | Status: DC

## 2017-04-29 MED ORDER — AMOXICILLIN-POT CLAVULANATE 875-125 MG PO TABS: 1.0000 | ORAL_TABLET | Freq: Two times a day (BID) | ORAL | 0 refills | Status: DC

## 2017-04-29 MED ORDER — AMOXICILLIN-POT CLAVULANATE 875-125 MG PO TABS: 1.0000 | ORAL_TABLET | Freq: Two times a day (BID) | ORAL | 0 refills | Status: AC

## 2017-04-29 NOTE — Progress Notes (Signed)
Subjective:  Jeremiah Beck is a 66 y.o. male who presents for evaluation of possible sinusitis.  Symptoms include cough described as productive of yellow sputum, nasal congestion, post nasal drip, productive cough with yellow colored sputum and sore throat.  Onset of symptoms was 8 days ago, and has been gradually worsening since that time.  Treatment to date:  acetaminophen and Mucinex.  High risk factors for influenza complications:  age greater than 30 years of age.  The following portions of the patient's history were reviewed and updated as appropriate:  allergies, current medications and past medical history.  Constitutional: positive for fatigue Eyes: negative Ears, nose, mouth, throat, and face: positive for nasal congestion, sore throat and feeling of ear fullness bilaterally, negative for earaches and hoarseness Respiratory: positive for cough Cardiovascular: negative Neurological: positive for headaches Behavioral/Psych: negative Objective:  BP 120/70   Pulse 95   Temp 98.5 F (36.9 C) (Oral)   Resp 16   SpO2 95%  General appearance: alert, cooperative and fatigued Head: Normocephalic, without obvious abnormality, atraumatic Eyes: conjunctivae/corneas clear. PERRL, EOM's intact. Fundi benign. Ears: abnormal TM right ear - mucoid middle ear fluid and abnormal TM left ear - mucoid middle ear fluid Nose: turbinates red, edematous, sinus tenderness bilateral Throat: lips, mucosa, and tongue normal; teeth and gums normal Lungs: clear to auscultation bilaterally Heart: regular rate and rhythm, S1, S2 normal, no murmur, click, rub or gallop Extremities: extremities normal, atraumatic, no cyanosis or edema Pulses: 2+ and symmetric Skin: Skin color, texture, turgor normal. No rashes or lesions Lymph nodes: Cervical, supraclavicular, and axillary nodes normal. and cervical and submandibular nodes normal Neurologic: Grossly normal    Assessment:  sinusitis    Plan:  Discussed  diagnosis and treatment of sinusitis. Suggested symptomatic OTC remedies. Supportive care with appropriate antipyretics and fluids. Nasal saline spray for congestion. Augmentin per orders. Nasal steroids per orders. Follow up as needed. Flonase 2 sprays in each nostril daily for 10 days.  Patient encouraged to increase fluids.  Patient to continue Mucinex.  Patient verbalized understanding.

## 2017-06-24 ENCOUNTER — Encounter: Payer: Managed Care, Other (non HMO) | Admitting: Family Medicine

## 2017-07-11 ENCOUNTER — Other Ambulatory Visit: Payer: Self-pay | Admitting: Family Medicine

## 2017-07-11 NOTE — Telephone Encounter (Signed)
Please see 02/23/17 and 02/28/17 notes about patient needing labs They've been canceled for some reason; please re-order if not up front or at employee health I still don't have any lab results and really need these Please ask patient to have labs done ASAP

## 2017-07-12 NOTE — Telephone Encounter (Signed)
Called pt informed him of the need for labs, pt states that he will try to have them done soon. Pt states that he has a PE this week on 07/15/17.

## 2017-07-15 ENCOUNTER — Encounter: Payer: Managed Care, Other (non HMO) | Admitting: Family Medicine

## 2017-08-24 ENCOUNTER — Other Ambulatory Visit: Payer: Self-pay | Admitting: Family Medicine

## 2017-08-24 DIAGNOSIS — N401 Enlarged prostate with lower urinary tract symptoms: Secondary | ICD-10-CM

## 2017-08-24 NOTE — Telephone Encounter (Signed)
Please see the note from February 28, 2017 Patient is sorely in need of labs and never had them done apparently [facility] refer him instead to urologist for this medicine (finasteride); referral entered

## 2017-08-24 NOTE — Telephone Encounter (Signed)
Pt.notified

## 2017-08-26 ENCOUNTER — Telehealth: Payer: Self-pay | Admitting: Family Medicine

## 2017-08-26 ENCOUNTER — Other Ambulatory Visit: Payer: Self-pay

## 2017-08-26 DIAGNOSIS — Z1159 Encounter for screening for other viral diseases: Secondary | ICD-10-CM

## 2017-08-26 DIAGNOSIS — Z125 Encounter for screening for malignant neoplasm of prostate: Secondary | ICD-10-CM

## 2017-08-26 DIAGNOSIS — Z113 Encounter for screening for infections with a predominantly sexual mode of transmission: Secondary | ICD-10-CM

## 2017-08-26 DIAGNOSIS — Z0000 Encounter for general adult medical examination without abnormal findings: Secondary | ICD-10-CM

## 2017-08-26 DIAGNOSIS — Z114 Encounter for screening for human immunodeficiency virus [HIV]: Secondary | ICD-10-CM

## 2017-08-26 NOTE — Telephone Encounter (Signed)
Called pt informed him that the provider will not refill his mediation until he has labs done. Pt became very up set and states that he does not understand why we can't just give him the pills. Advised pt that we have been trying to get labs on him since October 2018 (documented several times) Pt states that he will try to get labs on Monday.

## 2017-08-26 NOTE — Telephone Encounter (Signed)
Copied from [place] [id number]. Topic: Quick Communication - Rx Refill/Question >> Aug 26, 2017 10:56 AM Oliver Pila B wrote: Pt states he needs blood work done this is what the pharmacist told him Medication: finasteride (PROSCAR) 5 MG tablet [[id number]]  Has the patient contacted their pharmacy? Yes.   (Agent: If no, request that the patient contact the pharmacy for the refill.) Preferred Pharmacy (with phone number or street name): [facility] Agent: Please be advised that RX refills may take up to 3 business days. We ask that you follow-up with your pharmacy.

## 2017-08-29 ENCOUNTER — Telehealth: Payer: Self-pay

## 2017-08-29 ENCOUNTER — Other Ambulatory Visit: Payer: Self-pay

## 2017-08-29 DIAGNOSIS — Z0000 Encounter for general adult medical examination without abnormal findings: Secondary | ICD-10-CM

## 2017-08-29 DIAGNOSIS — Z1159 Encounter for screening for other viral diseases: Secondary | ICD-10-CM

## 2017-08-29 DIAGNOSIS — Z113 Encounter for screening for infections with a predominantly sexual mode of transmission: Secondary | ICD-10-CM

## 2017-08-29 DIAGNOSIS — Z114 Encounter for screening for human immunodeficiency virus [HIV]: Secondary | ICD-10-CM

## 2017-08-29 DIAGNOSIS — Z125 Encounter for screening for malignant neoplasm of prostate: Secondary | ICD-10-CM

## 2017-08-29 NOTE — Telephone Encounter (Signed)
Jeremiah Beck returned my call and stated that they are unable to do the order for a urine cytology b/c that is not something that they can do there. She was unsure if another lab needed to be ordered or what Dr. Sanda Klein wants them to do at this point.  Please advise

## 2017-08-29 NOTE — Addendum Note (Signed)
Addended by: Carlene Coria on: 08/29/2017 09:39 AM   Modules accepted: Orders

## 2017-08-29 NOTE — Addendum Note (Signed)
Addended by: Carlene Coria on: 08/29/2017 09:22 AM   Modules accepted: Orders

## 2017-08-29 NOTE — Telephone Encounter (Addendum)
I tried to contact this patient but was unable to get through. A message was left for Jeremiah Beck to give our office a call back when she got the chance.  Copied from [place] [id number]. Topic: Referral - Question >> Aug 29, 2017 10:01 AM Percell Belt A wrote: Reason for CRM: Jeremiah Beck from employee health and wel[employer] called in and they rec a referral for this pt and they need someone to contact them as soon as possible about the order that was put in.  Pt was suppose to come today.  They are have several questions about order   [phone]

## 2017-08-30 ENCOUNTER — Other Ambulatory Visit: Payer: Self-pay | Admitting: Family Medicine

## 2017-08-30 LAB — COMPREHENSIVE METABOLIC PANEL
A/G RATIO: 1.6 (ref 1.2–2.2)
ALT: 15 IU/L (ref 0–44)
AST: 20 IU/L (ref 0–40)
Albumin: 4.5 g/dL (ref 3.6–4.8)
Alkaline Phosphatase: 91 IU/L (ref 39–117)
BILIRUBIN TOTAL: 0.7 mg/dL (ref 0.0–1.2)
BUN/Creatinine Ratio: 12 (ref 10–24)
BUN: 11 mg/dL (ref 8–27)
CHLORIDE: 105 mmol/L (ref 96–106)
CO2: 22 mmol/L (ref 20–29)
Calcium: 9.4 mg/dL (ref 8.6–10.2)
Creatinine, Ser: 0.95 mg/dL (ref 0.76–1.27)
GFR calc Af Amer: 95 mL/min/{1.73_m2} (ref >59)
GFR calc non Af Amer: 82 mL/min/{1.73_m2} (ref >59)
GLUCOSE: 92 mg/dL (ref 65–99)
Globulin, Total: 2.8 g/dL (ref 1.5–4.5)
POTASSIUM: 4.9 mmol/L (ref 3.5–5.2)
Sodium: 145 mmol/L — ABNORMAL HIGH (ref 134–144)
Total Protein: 7.3 g/dL (ref 6.0–8.5)

## 2017-08-30 LAB — CBC WITH DIFFERENTIAL/PLATELET
Basophils Absolute: 0.1 10*3/uL (ref 0.0–0.2)
Basos: 2 %
EOS (ABSOLUTE): 0.4 10*3/uL (ref 0.0–0.4)
EOS: 7 %
HEMATOCRIT: 43.8 % (ref 37.5–51.0)
HEMOGLOBIN: 15.6 g/dL (ref 13.0–17.7)
IMMATURE GRANULOCYTES: 0 %
Immature Grans (Abs): 0 10*3/uL (ref 0.0–0.1)
Lymphocytes Absolute: 1.5 10*3/uL (ref 0.7–3.1)
Lymphs: 26 %
MCH: 32.4 pg (ref 26.6–33.0)
MCHC: 35.6 g/dL (ref 31.5–35.7)
MCV: 91 fL (ref 79–97)
MONOCYTES: 14 %
MONOS ABS: 0.8 10*3/uL (ref 0.1–0.9)
NEUTROS PCT: 51 %
Neutrophils Absolute: 2.9 10*3/uL (ref 1.4–7.0)
Platelets: 260 10*3/uL (ref 150–379)
RBC: 4.81 x10E6/uL (ref 4.14–5.80)
RDW: 13.9 % (ref 12.3–15.4)
WBC: 5.6 10*3/uL (ref 3.4–10.8)

## 2017-08-30 LAB — LIPID PANEL
CHOLESTEROL TOTAL: 199 mg/dL (ref 100–199)
Chol/HDL Ratio: 3.4 ratio (ref 0.0–5.0)
HDL: 58 mg/dL (ref >39)
LDL Calculated: 125 mg/dL — ABNORMAL HIGH (ref 0–99)
Triglycerides: 80 mg/dL (ref 0–149)
VLDL Cholesterol Cal: 16 mg/dL (ref 5–40)

## 2017-08-30 LAB — HEPATITIS C ANTIBODY

## 2017-08-30 LAB — RPR QUALITATIVE: RPR: NONREACTIVE

## 2017-08-30 LAB — PSA: PROSTATE SPECIFIC AG, SERUM: 0.2 ng/mL (ref 0.0–4.0)

## 2017-08-30 LAB — HIV ANTIBODY (ROUTINE TESTING W REFLEX): HIV SCREEN 4TH GENERATION: NONREACTIVE

## 2017-08-30 MED ORDER — FINASTERIDE 5 MG PO TABS
5.0000 mg | ORAL_TABLET | Freq: Every day | ORAL | 11 refills | Status: DC
Start: 2017-08-30 — End: 2018-09-14

## 2017-08-30 MED ORDER — ATORVASTATIN CALCIUM 20 MG PO TABS: 20.0000 mg | ORAL_TABLET | Freq: Every day | ORAL | 1 refills | Status: DC

## 2017-08-30 NOTE — Telephone Encounter (Signed)
I spoke back with Jeremiah Beck and she stated that he is an [employer] employee who gets labs done free when it's drawn in office. He can go to another location but will have to pay which she thinks he will be upset about that.  Please advise if he really needs the urine cytology.

## 2017-08-30 NOTE — Telephone Encounter (Signed)
We will wait for Dr. Sanda Klein.  Please notify patient and forward to her inbox Thank you

## 2017-08-30 NOTE — Addendum Note (Signed)
Addended by: Chilton Greathouse on: 08/30/2017 04:19 PM   Modules accepted: Orders

## 2017-08-30 NOTE — Progress Notes (Signed)
Start statin, recheck lipids in 6-8 weeks ([facility] to order)

## 2017-08-31 NOTE — Addendum Note (Signed)
Addended by: Chilton Greathouse on: 08/31/2017 09:43 AM   Modules accepted: Orders

## 2017-09-04 NOTE — Telephone Encounter (Signed)
I do not know why someone ordered a urine cytology If he wants urine tested for gonorrhea, chlamydia, and trichomonas, [facility] can order that Thank you

## 2017-09-05 ENCOUNTER — Ambulatory Visit: Payer: Self-pay | Admitting: Family Medicine

## 2017-09-05 VITALS — BP 132/63 | HR 62 | Resp 14 | Ht 71.0 in | Wt 206.0 lb

## 2017-09-05 DIAGNOSIS — Z0000 Encounter for general adult medical examination without abnormal findings: Secondary | ICD-10-CM

## 2017-09-05 NOTE — Progress Notes (Signed)
Subjective: Annual biometrics screening physical exam Patient presents for his annual biometric screening physical exam component only.  Patient's primary care provider is Dr. Sanda Klein, who he sees regularly.  Patient reports getting regular exercise at work.  Patient reports eating a healthy, well-rounded diet.  Patient has already completed his biometric screening blood work and been counseled by his primary care provider regarding these results.   Patient works for maintenance. Patient denies any other issues or concerns.   Review of Systems Unremarkable  Objective  Physical Exam General: Awake, alert and oriented. No acute distress. Well developed, hydrated and nourished. Appears stated age.  HEENT: Supple neck without adenopathy. Sclera is non-icteric. The ear canal is clear without discharge. The tympanic membrane is normal in appearance with normal landmarks and cone of light. Nasal mucosa is pink and moist. Oral mucosa is pink and moist. The pharynx is normal in appearance without tonsillar swelling or exudates.  Skin: Skin in warm, dry and intact without rashes or lesions. Appropriate color for ethnicity. Cardiac: Heart rate and rhythm are normal. No murmurs, gallops, or rubs are auscultated.  Respiratory: The chest wall is symmetric and without deformity. No signs of respiratory distress. Lung sounds are clear in all lobes bilaterally without rales, ronchi, or wheezes.  Neurological: The patient is awake, alert and oriented to person, place, and time with normal speech.  Memory is normal and thought processes intact. No gait abnormalities are appreciated.  Psychiatric: Appropriate mood and affect.   Assessment Annual biometrics screening physical exam  Plan  Encouraged routine visits with primary care provider.  Encouraged eating a healthy, well-rounded diet and getting regular exercise.

## 2017-09-05 NOTE — Telephone Encounter (Signed)
Pt will not get done already had other labs done.

## 2017-09-09 ENCOUNTER — Ambulatory Visit: Payer: Managed Care, Other (non HMO) | Admitting: Urology

## 2018-07-15 IMAGING — CR DG CERVICAL SPINE COMPLETE 4+V
1 series · 7 of 7 positions shown · non-contrast
Comparison: None.

CLINICAL DATA: Pain throughout the entire neck and both shoulders

EXAM:
CERVICAL SPINE - COMPLETE 4+ VIEW

[Series 1: dg cervical spine complete · 0.14mm/px · 7 of 7 slices shown]
[im 1/7]
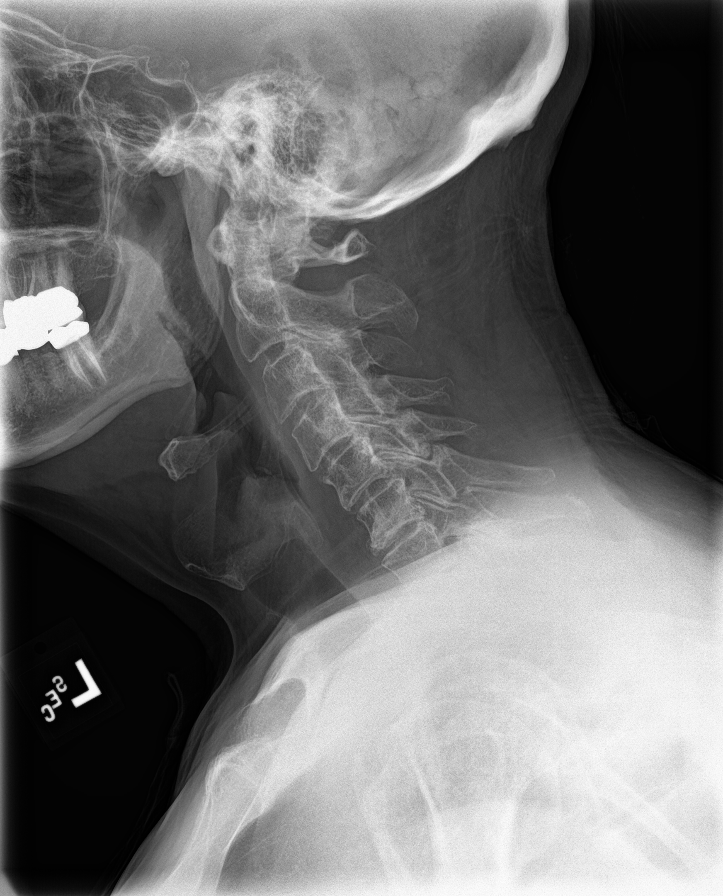
[im 2/7]
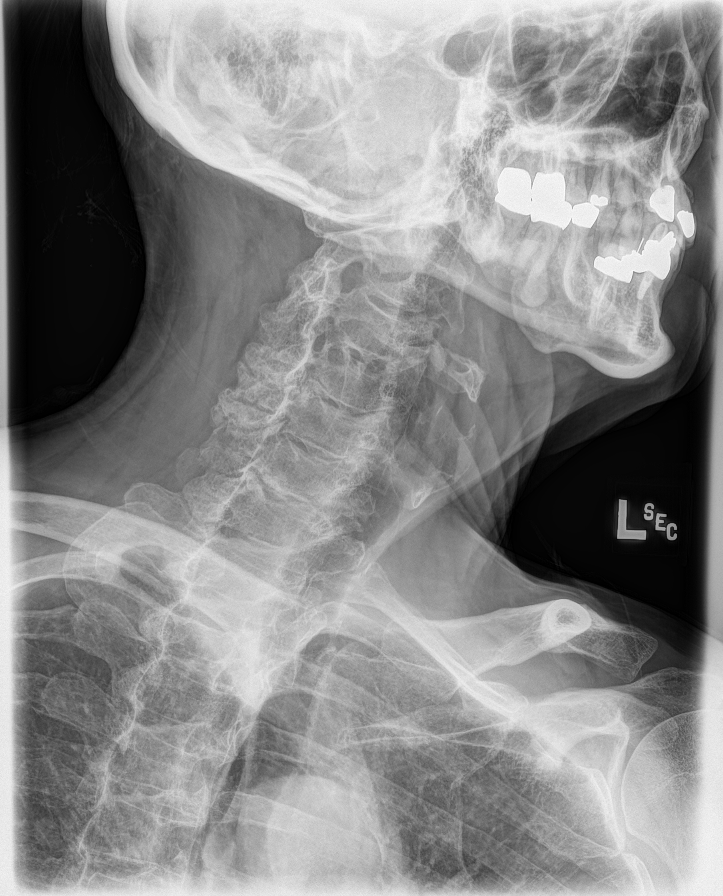
[im 3/7]
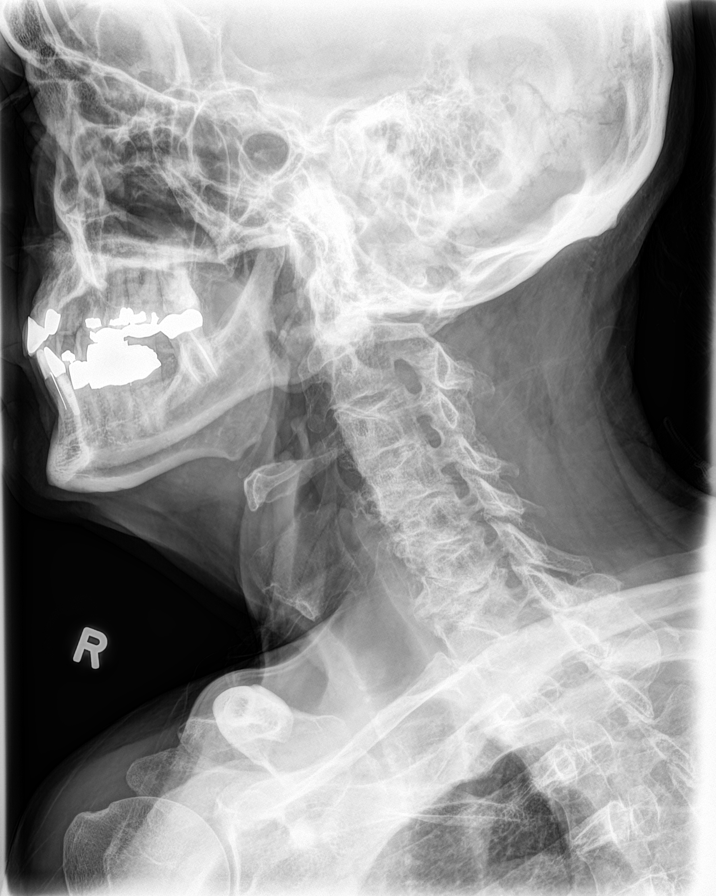
[im 4/7]
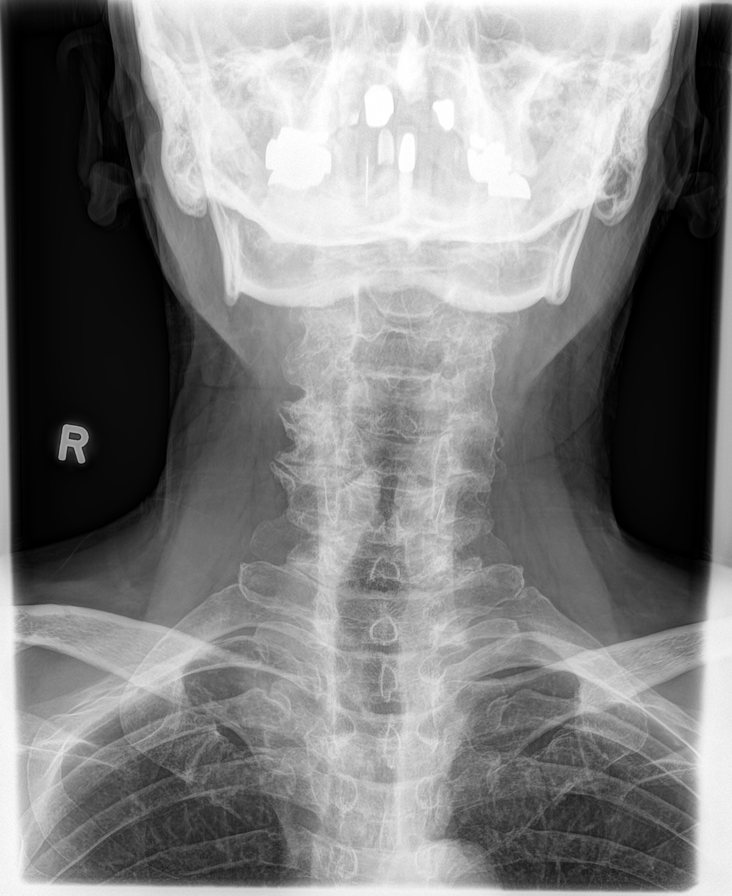
[im 5/7]
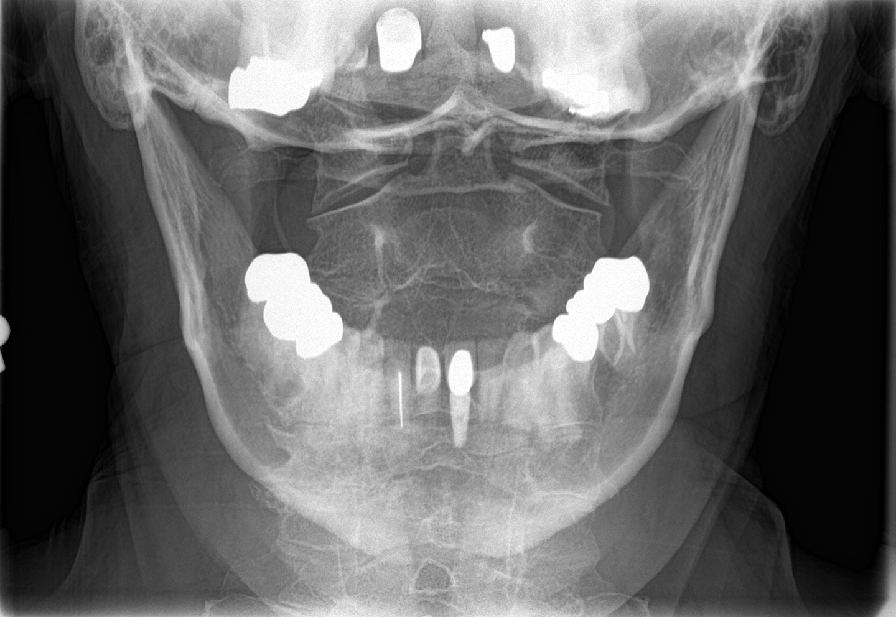
[im 6/7]
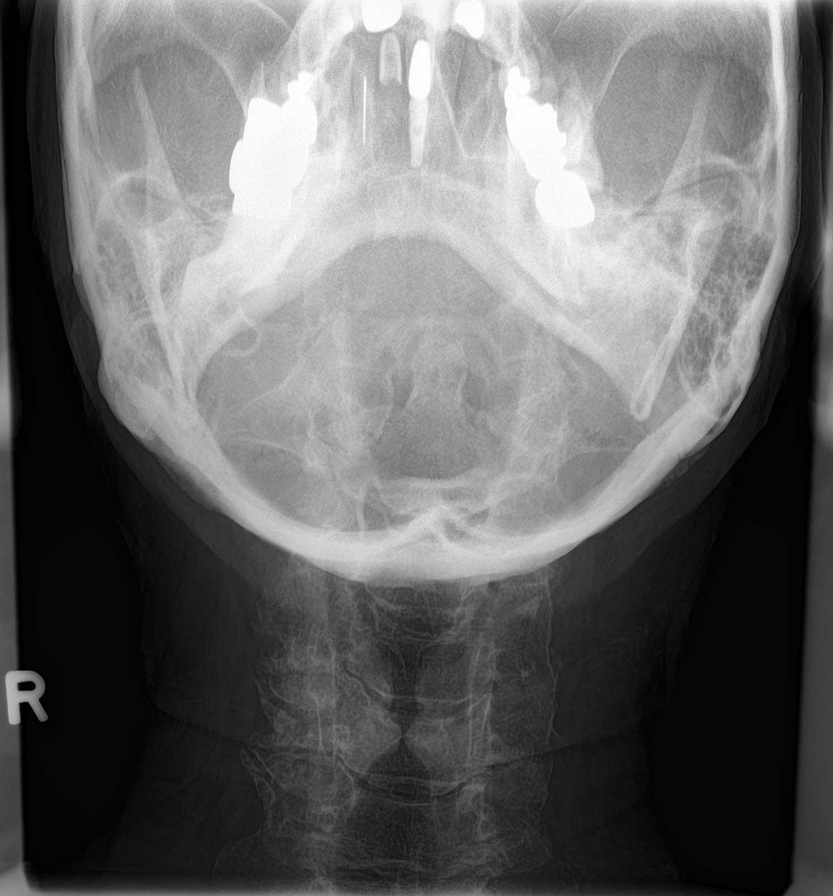
[im 7/7]
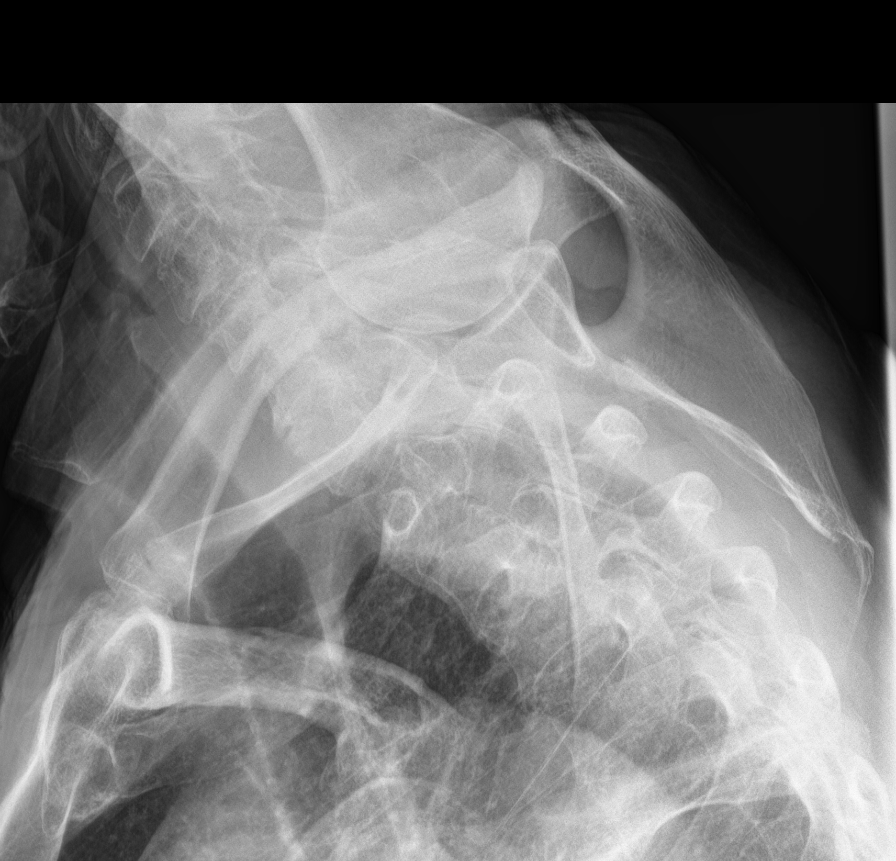

[7 of 7 positions shown; findings below may reference images not displayed]

FINDINGS: Minimal offset of left lateral mass. Straightening of the cervical
spine. Vertebral body heights are normal. Mild diffuse degenerative
changes C3 through C6 with moderate degenerative changes at C6-C7.
Normal prevertebral soft tissue thickness. Right greater than left
the set degenerative changes with probable foraminal narrowing.
IMPRESSION: 1. No acute osseous abnormality
2. Straightening of the cervical spine with mild to moderate diffuse
degenerative changes and right greater than left facet arthropathy.

## 2018-07-19 ENCOUNTER — Other Ambulatory Visit: Payer: Self-pay

## 2018-07-19 ENCOUNTER — Ambulatory Visit: Payer: Self-pay | Admitting: Nurse Practitioner

## 2018-07-19 ENCOUNTER — Encounter: Payer: Self-pay | Admitting: Nurse Practitioner

## 2018-07-19 DIAGNOSIS — M542 Cervicalgia: Secondary | ICD-10-CM

## 2018-07-19 DIAGNOSIS — M62838 Other muscle spasm: Secondary | ICD-10-CM

## 2018-07-19 DIAGNOSIS — L282 Other prurigo: Secondary | ICD-10-CM

## 2018-07-19 MED ORDER — TIZANIDINE HCL 2 MG PO CAPS: 2.0000 mg | ORAL_CAPSULE | Freq: Three times a day (TID) | ORAL | 0 refills | Status: DC

## 2018-07-19 MED ORDER — TRIAMCINOLONE ACETONIDE 0.1 % EX CREA: 1.0000 "application " | TOPICAL_CREAM | Freq: Two times a day (BID) | CUTANEOUS | 0 refills | Status: DC
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
## 2018-09-13 ENCOUNTER — Other Ambulatory Visit: Payer: Self-pay | Admitting: Family Medicine

## 2018-09-14 NOTE — Telephone Encounter (Signed)
Patient needs routine appointment for further refills.

## 2018-09-14 NOTE — Telephone Encounter (Signed)
appt scheduled

## 2018-10-16 ENCOUNTER — Ambulatory Visit: Payer: Managed Care, Other (non HMO) | Admitting: Nurse Practitioner

## 2018-10-16 ENCOUNTER — Other Ambulatory Visit: Payer: Self-pay

## 2018-10-16 ENCOUNTER — Encounter: Payer: Self-pay | Admitting: Nurse Practitioner

## 2018-10-16 VITALS — BP 122/60 | HR 85 | Temp 97.8°F | Resp 14 | Ht 71.0 in | Wt 199.5 lb

## 2018-10-16 DIAGNOSIS — E7841 Elevated Lipoprotein(a): Secondary | ICD-10-CM

## 2018-10-16 DIAGNOSIS — N401 Enlarged prostate with lower urinary tract symptoms: Secondary | ICD-10-CM

## 2018-10-16 DIAGNOSIS — M47812 Spondylosis without myelopathy or radiculopathy, cervical region: Secondary | ICD-10-CM

## 2018-10-16 MED ORDER — FINASTERIDE 5 MG PO TABS: 5.0000 mg | ORAL_TABLET | Freq: Every day | ORAL | 3 refills | Status: DC

## 2018-10-16 NOTE — Progress Notes (Signed)
Name: Jeremiah Beck   MRN: [id number]    DOB: 1951-01-21   Date:10/16/2018       Progress Note  Subjective  Chief Complaint  Chief Complaint  Patient presents with  . Follow-up  . Medication Refill    HPI  Patient takes finasteride for BPH states has been on it for years states he has good urinary flow but does have some frequency. States he drinks a lot of water and thinks it is related to that. Denies hesitancy or urgency.   Patient states chronic neck pain is stable. Does not take medication for this.   Patient states he has a pretty healthy diet in general-avoids red meats and has plenty of vegetables, but wants to start taking olive oil and lemon juice to improve cholesterol.   PHQ2/9: Depression screen [facility] 2/9 10/16/2018 07/19/2018 02/22/2017 06/23/2016 05/23/2015  Decreased Interest 0 0 0 0 0  Down, Depressed, Hopeless 0 0 0 0 1  PHQ - 2 Score 0 0 0 0 1  Altered sleeping 0 - - - -  Tired, decreased energy 0 - - - -  Change in appetite 0 - - - -  Feeling bad or failure about yourself  0 - - - -  Trouble concentrating 0 - - - -  Moving slowly or fidgety/restless 0 - - - -  Suicidal thoughts 0 - - - -  PHQ-9 Score 0 - - - -  Difficult doing work/chores Not difficult at all - - - -     PHQ reviewed. Negative  Patient Active Problem List   Diagnosis Date Noted  . Degenerative arthritis of cervical spine 02/23/2017  . Cervical spine pain 02/22/2017  . Prostate cancer screening 06/23/2016  . Screen for STD (sexually transmitted disease) 06/23/2016  . Preventative health care 05/23/2015  . Colon cancer screening 05/23/2015  . Screening for HIV (human immunodeficiency virus) 05/23/2015  . Need for hepatitis C screening test 05/23/2015  . Benign prostatic hyperplasia   . ED (erectile dysfunction)     Past Medical History:  Diagnosis Date  . BPH (benign prostatic hypertrophy)   . Degenerative arthritis of cervical spine 02/23/2017   xrays Oct 2018  . ED (erectile  dysfunction)     Past Surgical History:  Procedure Laterality Date  . HERNIA REPAIR    . 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 Smoking status: Light Tobacco Smoker    Types: Cigarettes  . Smokeless tobacco: Never Used  Substance Use Topics  . Alcohol use: No     Current Outpatient Medications:  .  finasteride (PROSCAR) 5 MG tablet, TAKE 1 TABLET BY MOUTH ONCE DAILY, Disp: 30 tablet, Rfl: 0 .  Multiple Vitamins-Minerals (ONE-A-DAY MENS 50+ ADVANTAGE) TABS, Take by mouth., Disp: , Rfl:  .  triamcinolone cream (KENALOG) 0.1 %, Apply 1 application topically 2 (two) times daily., Disp: 30 g, Rfl: 0 .  Turmeric 500 MG CAPS, Take by mouth., Disp: , Rfl:  .  aspirin EC 81 MG tablet, Take 81 mg by mouth daily., Disp: , Rfl:  .  Multiple Vitamins-Minerals (EMERGEN-C VITAMIN C PO), Take by mouth., Disp: , Rfl:  .  tizanidine (ZANAFLEX) 2 MG capsule, Take 1 capsule (2 mg total) by mouth 3 (three) times daily. (Patient not taking: Reported on 10/16/2018), Disp: 15 capsule, Rfl: 0  No Known Allergies  ROS   No other specific complaints in a complete  review of systems (except as listed in HPI above).  Objective  Vitals:   10/16/18 1553  BP: 122/60  Pulse: 85  Resp: 14  Temp: 97.8 F (36.6 C)  SpO2: 99%  Weight: 199 lb 8 oz (90.5 kg)  Height: 5\' 11"  (1.803 m)     Body mass index is 27.82 kg/m.  Nursing Note and Vital Signs reviewed.  Physical Exam Constitutional:      Appearance: He is well-developed. He is not diaphoretic.  HENT:     Head: Normocephalic and atraumatic.  Cardiovascular:     Rate and Rhythm: Normal rate.  Pulmonary:     Effort: Pulmonary effort is normal.  Skin:    General: Skin is warm and dry.     Findings: No erythema.  Neurological:     Mental Status: He is alert and oriented to person, place, and time.     Coordination: Coordination normal.  Psychiatric:        Behavior: Behavior normal.         Thought Content: Thought content normal.        Judgment: Judgment normal.        No results found for this or any previous visit (from the past 48 hour(s)).  Assessment & Plan  1. Benign prostatic hyperplasia with lower urinary tract symptoms, symptom details unspecified Discussed adjusting medicine, will stay here now and monitor.  - finasteride (PROSCAR) 5 MG tablet; Take 1 tablet (5 mg total) by mouth daily.  Dispense: 90 tablet; Refill: 3  2. Spondylosis of cervical region without myelopathy or radiculopathy Stable, he takes turmeric and states it helps with pain.   3. Elevated lipoprotein(a) Discussed diet and recommended fishoils.

## 2018-10-16 NOTE — Patient Instructions (Signed)
Lab Results  Component Value Date   CHOL 199 08/29/2017   HDL 58 08/29/2017   LDLCALC 125 (H) 08/29/2017   TRIG 80 08/29/2017   CHOLHDL 3.4 08/29/2017    Our goal for your LDL is under 100 right now. Bad cholesterol, also called low-density lipoprotein (LDL), carries cholesterol and other fats that your liver makes to your body tissue. If it builds up in blood vessels, LDL can cause heart disease and other health problems. Your LDL level should be below 100.  Eat: Eat 20 to 30 grams of soluble fiber every day. Foods such as fruits and vegetables, whole grains, beans, peas, nuts, and seeds can help lower LDL. Avoid: Saturated fats (Dairy foods - such as butter, cream, ghee, regular-fat milk and cheese. Meat - such as fatty cuts of beef, pork and lamb, processed meats like salami, sausages and the skin on chicken. Lard., fatty snack foods, cakes, biscuits, pies and deep fried foods) Avoid smoking

## 2019-01-16 ENCOUNTER — Other Ambulatory Visit: Payer: Self-pay

## 2019-01-16 ENCOUNTER — Encounter: Payer: Self-pay | Admitting: Family Medicine

## 2019-01-16 ENCOUNTER — Ambulatory Visit (INDEPENDENT_AMBULATORY_CARE_PROVIDER_SITE_OTHER): Payer: Managed Care, Other (non HMO) | Admitting: Family Medicine

## 2019-01-16 VITALS — BP 124/70 | HR 57 | Temp 98.1°F | Resp 14 | Ht 71.0 in | Wt 201.4 lb

## 2019-01-16 DIAGNOSIS — Z125 Encounter for screening for malignant neoplasm of prostate: Secondary | ICD-10-CM

## 2019-01-16 DIAGNOSIS — Z0000 Encounter for general adult medical examination without abnormal findings: Secondary | ICD-10-CM | POA: Diagnosis not present

## 2019-01-16 DIAGNOSIS — N401 Enlarged prostate with lower urinary tract symptoms: Secondary | ICD-10-CM

## 2019-01-16 DIAGNOSIS — J310 Chronic rhinitis: Secondary | ICD-10-CM

## 2019-01-16 DIAGNOSIS — R001 Bradycardia, unspecified: Secondary | ICD-10-CM

## 2019-01-16 DIAGNOSIS — Z13220 Encounter for screening for lipoid disorders: Secondary | ICD-10-CM

## 2019-01-16 DIAGNOSIS — E7800 Pure hypercholesterolemia, unspecified: Secondary | ICD-10-CM | POA: Insufficient documentation

## 2019-01-16 MED ORDER — LEVOCETIRIZINE DIHYDROCHLORIDE 5 MG PO TABS: 5.0000 mg | ORAL_TABLET | Freq: Every evening | ORAL | 2 refills | Status: DC

## 2019-01-16 NOTE — Patient Instructions (Signed)
[email or web address]  Look up heart.org to learn about cholesterol and the medications   Preventive Care 69 Years and Older, Male Preventive care refers to lifestyle choices and visits with your health care provider that can promote health and wellness. This includes:  A yearly physical exam. This is also called an annual well check.  Regular dental and eye exams.  Immunizations.  Screening for certain conditions.  Healthy lifestyle choices, such as diet and exercise. What can I expect for my preventive care visit? Physical exam Your health care provider will check:  Height and weight. These may be used to calculate body mass index (BMI), which is a measurement that tells if you are at a healthy weight.  Heart rate and blood pressure.  Your skin for abnormal spots. Counseling Your health care provider may ask you questions about:  Alcohol, tobacco, and drug use.  Emotional well-being.  Home and relationship well-being.  Sexual activity.  Eating habits.  History of falls.  Memory and ability to understand (cognition).  Work and work Statistician. What immunizations do I need?  Influenza (flu) vaccine  This is recommended every year. Tetanus, diphtheria, and pertussis (Tdap) vaccine  You may need a Td booster every 10 years. Varicella (chickenpox) vaccine  You may need this vaccine if you have not already been vaccinated. Zoster (shingles) vaccine  You may need this after age 69. Pneumococcal conjugate (PCV13) vaccine  One dose is recommended after age 69. Pneumococcal polysaccharide (PPSV23) vaccine  One dose is recommended after age 69. Measles, mumps, and rubella (MMR) vaccine  You may need at least one dose of MMR if you were born in 1957 or later. You may also need a second dose. Meningococcal conjugate (MenACWY) vaccine  You may need  this if you have certain conditions. Hepatitis A vaccine  You may need this if you have certain conditions or if you travel or work in places where you may be exposed to hepatitis A. Hepatitis B vaccine  You may need this if you have certain conditions or if you travel or work in places where you may be exposed to hepatitis B. Haemophilus influenzae type b (Hib) vaccine  You may need this if you have certain conditions. You may receive vaccines as individual doses or as more than one vaccine together in one shot (combination vaccines). Talk with your health care provider about the risks and benefits of combination vaccines. What tests do I need? Blood tests  Lipid and cholesterol levels. These may be checked every 5 years, or more frequently depending on your overall health.  Hepatitis C test.  Hepatitis B test. Screening  Lung cancer screening. You may have this screening every year starting at age 69 if you have a 30-pack-year history of smoking and currently smoke or have quit within the past 15 years.  Colorectal cancer screening. All adults should have this screening starting at age 40 and continuing until age 69. Your health care provider may recommend screening at age 69 if you are at increased risk. You will have tests every 1-10 years, depending on your results and the type of screening test.  Prostate cancer screening. Recommendations will vary depending on your family history and other risks.  Diabetes screening. This is done by checking your blood sugar (glucose) after you have not eaten for a while (fasting). You may have this done every 1-3 years.  Abdominal aortic aneurysm (AAA) screening. You may need this if you are a current or former smoker.  Sexually  transmitted disease (STD) testing. Follow these instructions at home: Eating and drinking  Eat a diet that includes fresh fruits and vegetables, whole grains, lean protein, and low-fat dairy products. Limit your  intake of foods with high amounts of sugar, saturated fats, and salt.  Take vitamin and mineral supplements as recommended by your health care provider.  Do not drink alcohol if your health care provider tells you not to drink.  If you drink alcohol: ? Limit how much you have to 0-2 drinks a day. ? Be aware of how much alcohol is in your drink. In the [place] one drink equals one 12 oz bottle of beer (355 mL), one 5 oz glass of wine (148 mL), or one 1 oz glass of hard liquor (44 mL). Lifestyle  Take daily care of your teeth and gums.  Stay active. Exercise for at least 30 minutes on 5 or more days each week.  Do not use any products that contain nicotine or tobacco, such as cigarettes, e-cigarettes, and chewing tobacco. If you need help quitting, ask your health care provider.  If you are sexually active, practice safe sex. Use a condom or other form of protection to prevent STIs (sexually transmitted infections).  Talk with your health care provider about taking a low-dose aspirin or statin. What's next?  Visit your health care provider once a year for a well check visit.  Ask your health care provider how often you should have your eyes and teeth checked.  Stay up to date on all vaccines. This information is not intended to replace advice given to you by your health care provider. Make sure you discuss any questions you have with your health care provider. Document Released: 05/16/2015 Document Revised: 04/13/2018 Document Reviewed: 04/13/2018 Elsevier Patient Education  2020 [employer].

## 2019-01-16 NOTE — Progress Notes (Signed)
Patient: Jeremiah Beck, Male    DOB: January 07, 1951, 68 y.o.   MRN: [id number] Delsa Grana, PA-C Visit Date: 01/16/2019  Today's Provider: Delsa Grana, PA-C   Chief Complaint  Patient presents with   Annual Exam    please order labs but would like to get done at work for free   Subjective:   Annual physical exam:  Jeremiah Beck is a 68 y.o. male who presents today for health maintenance and annual & complete physical exam.  He feels fairly well.  He reports not exercising  - still working and climbing ladders and strenuous labor at his job, at home keeps busy, yard work. Diet generally described veggies when he can, breakfast a bowl of grits, at work he has trouble sometimes avoiding fast food, egg sandwich, veggie plates and [place] mikes (sandwiches) He reports he is sleeping well.  USPSTF grade A and B recommendations - reviewed and addressed today  Depression:  Phq 9 completed today by patient, was reviewed by me with patient in the room, score is  negative, pt feels good, happy, stays better [facility] 2/9 Scores 01/16/2019 10/16/2018 07/19/2018 02/22/2017  PHQ - 2 Score 0 0 0 0  PHQ- 9 Score 0 0 - -   Depression screen [facility] 2/9 01/16/2019 10/16/2018 07/19/2018 02/22/2017 06/23/2016  Decreased Interest 0 0 0 0 0  Down, Depressed, Hopeless 0 0 0 0 0  PHQ - 2 Score 0 0 0 0 0  Altered sleeping 0 0 - - -  Tired, decreased energy 0 0 - - -  Change in appetite 0 0 - - -  Feeling bad or failure about yourself  0 0 - - -  Trouble concentrating 0 0 - - -  Moving slowly or fidgety/restless 0 0 - - -  Suicidal thoughts 0 0 - - -  PHQ-9 Score 0 0 - - -  Difficult doing work/chores Not difficult at all Not difficult at all - - -    Hep C Screening:  Done in the past STD testing and prevention (HIV/chl/gon/syphilis): done in the past, none wanted today Prostate cancer:  Wants to do PSA lab today, hx of BPH  Intimate partner violence: feels safe  Urinary Symptoms:  IPSS Questionnaire  (AUA-7): Over the past month   1)  How often have you had a sensation of not emptying your bladder completely after you finish urinating?  1 - Less than 1 time in 5  2)  How often have you had to urinate again less than two hours after you finished urinating? 5 - Almost always  3)  How often have you found you stopped and started again several times when you urinated?  0  4) How difficult have you found it to postpone urination?  0 - Not at all  5) How often have you had a weak urinary stream?  0 - Not at all  6) How often have you had to push or strain to begin urination?  0 - Not at all  7) How many times did you most typically get up to urinate from the time you went to bed until the time you got up in the morning?  1 - 1 time  Total score:  0-7 mildly symptomatic   8-19 moderately symptomatic   20-35 severely symptomatic  Attributes urinary frequency to how much water he's drinking  Advanced Care Planning:  A voluntary discussion about advance care planning including the explanation and discussion of advance directives.  Discussed health care proxy and Living will, and the patient was able to identify a health care proxy as .  Patient does not have a living will at present time.  "doesn't trust anybody really"  Skin cancer: hx of skin cancer on face in the past - Sees dermatology Dr. Phillip Heal, seeing annually  Colorectal cancer:  colonoscopy is Lung cancer:  Low Dose CT Chest recommended if Age 70-80 years, 30 pack-year currently smoking OR have quit w/in 15years. Patient does not qualify.      Social History   Tobacco Use   Smoking status: Never Smoker   Smokeless tobacco: Never Used  Substance Use Topics   Alcohol use: No     Alcohol screening:   Office Visit from 01/16/2019 in [facility]  AUDIT-C Score  1      AAA:  States he is not a former smoker, then doesn't qualify The USPSTF recommends one-time screening with ultrasonography in men ages 60 to  75 years who have ever smoked  ECG: in 2017 NSR  Blood pressure/Hypertension: BP Readings from Last 3 Encounters:  01/16/19 124/70  10/16/18 122/60  07/19/18 122/60   Weight/Obesity: Wt Readings from Last 3 Encounters:  01/16/19 201 lb 6.4 oz (91.4 kg)  10/16/18 199 lb 8 oz (90.5 kg)  07/19/18 208 lb 14.4 oz (94.8 kg)   BMI Readings from Last 3 Encounters:  01/16/19 28.09 kg/m  10/16/18 27.82 kg/m  07/19/18 29.14 kg/m    Lipids:  Lab Results  Component Value Date   CHOL 199 08/29/2017   Lab Results  Component Value Date   HDL 58 08/29/2017   Lab Results  Component Value Date   LDLCALC 125 (H) 08/29/2017   Lab Results  Component Value Date   TRIG 80 08/29/2017   Lab Results  Component Value Date   CHOLHDL 3.4 08/29/2017   No results found for: LDLDIRECT Based on the results of lipid panel his/her cardiovascular risk factor ( using Walls )  in the next 10 years is : The 10-year ASCVD risk score Mikey Bussing DC Brooke Bonito., et al., 2013) is: 13.9%   Values used to calculate the score:     Age: 89 years     Sex: Male     Is Non-Hispanic African American: No     Diabetic: No     Tobacco smoker: No     Systolic Blood Pressure: [id number] mmHg     Is BP treated: No     HDL Cholesterol: 58 mg/dL     Total Cholesterol: 199 mg/dL Glucose:  Glucose  Date Value Ref Range Status  08/29/2017 92 65 - 99 mg/dL Final    Social History      He  reports that he has never smoked. He has never used smokeless tobacco. He reports that he does not drink alcohol or use drugs.       Social History   Socioeconomic History   Marital status: Divorced    Spouse name: Not on file   Number of children: 0   Years of education: Not on file   Highest education level: Some college, no degree  Occupational History   Not on file  Social Needs   Financial resource strain: Not hard at all   Food insecurity    Worry: Never true    Inability: Never true   Transportation needs     Medical: No    Non-medical: No  Tobacco Use   Smoking status: Never  Smoker   Smokeless tobacco: Never Used  Substance and Sexual Activity   Alcohol use: No   Drug use: No   Sexual activity: Not Currently  Lifestyle   Physical activity    Days per week: 5 days    Minutes per session: 150+ min   Stress: To some extent  Relationships   Social connections    Talks on phone: Twice a week    Gets together: Once a week    Attends religious service: Never    Active member of club or organization: No    Attends meetings of clubs or organizations: Never    Relationship status: Divorced  Other Topics Concern   Not on file  Social History Narrative   Not on file         Social History   Socioeconomic History   Marital status: Divorced    Spouse name: Not on file   Number of children: 0   Years of education: Not on file   Highest education level: Some college, no degree  Occupational History   Not on file  Social Needs   Financial resource strain: Not hard at all   Food insecurity    Worry: Never true    Inability: Never true   Transportation needs    Medical: No    Non-medical: No  Tobacco Use   Smoking status: Never Smoker   Smokeless tobacco: Never Used  Substance and Sexual Activity   Alcohol use: No   Drug use: No   Sexual activity: Not Currently  Lifestyle   Physical activity    Days per week: 5 days    Minutes per session: 150+ min   Stress: To some extent  Relationships   Social connections    Talks on phone: Twice a week    Gets together: Once a week    Attends religious service: Never    Active member of club or organization: No    Attends meetings of clubs or organizations: Never    Relationship status: Divorced   Intimate partner violence    Fear of current or ex partner: No    Emotionally abused: No    Physically abused: No    Forced sexual activity: No  Other Topics Concern   Not on file  Social History Narrative     Not on file    Family History        Family Status  Relation Name Status   Father  Deceased at age 19       cancer   Mother  Deceased   PGM  (Not Specified)   Sister  Alive        His family history includes Alcohol abuse in his father; Cancer in his father and mother; Varicose Veins in his paternal grandmother.       Family History  Problem Relation Age of Onset   Cancer Father    Alcohol abuse Father    Cancer Mother    Varicose Veins Paternal Grandmother     Patient Active Problem List   Diagnosis Date Noted   Pure hypercholesterolemia 01/16/2019   Degenerative arthritis of cervical spine 02/23/2017   Benign prostatic hyperplasia    ED (erectile dysfunction)     Past Surgical History:  Procedure Laterality Date   HERNIA REPAIR     SKIN CANCER EXCISION     removed from face     Current Outpatient Medications:    finasteride (PROSCAR) 5 MG tablet, Take 1  tablet (5 mg total) by mouth daily., Disp: 90 tablet, Rfl: 3   Multiple Vitamins-Minerals (ONE-A-DAY MENS 50+ ADVANTAGE) TABS, Take by mouth., Disp: , Rfl:    Turmeric 500 MG CAPS, Take by mouth., Disp: , Rfl:    aspirin EC 81 MG tablet, Take 81 mg by mouth daily., Disp: , Rfl:    levocetirizine (XYZAL) 5 MG tablet, Take 1 tablet (5 mg total) by mouth every evening., Disp: 30 tablet, Rfl: 2   Multiple Vitamins-Minerals (EMERGEN-C VITAMIN C PO), Take by mouth., Disp: , Rfl:    triamcinolone cream (KENALOG) 0.1 %, Apply 1 application topically 2 (two) times daily. (Patient not taking: Reported on 01/16/2019), Disp: 30 g, Rfl: 0  No Known Allergies  Patient Care Team: Delsa Grana, PA-C as PCP - General (Family Medicine) Lada, Satira Anis, MD as PCP - Family Medicine (Family Medicine) Jannet Mantis, MD (Dermatology)  I personally reviewed active problem list, medication list, allergies, family history, social history, health maintenance, notes from last encounter, lab results with the  patient/caregiver today.  Review of Systems  Constitutional: Negative.  Negative for activity change, appetite change, fatigue and unexpected weight change.  HENT: Negative.   Eyes: Negative.   Respiratory: Negative.  Negative for cough, chest tightness, shortness of breath and wheezing.   Cardiovascular: Negative.  Negative for chest pain, palpitations and leg swelling.  Gastrointestinal: Negative.  Negative for abdominal pain, blood in stool, constipation, nausea and vomiting.  Endocrine: Negative.  Negative for heat intolerance, polydipsia, polyphagia and polyuria.  Genitourinary: Positive for frequency. Negative for decreased urine volume, difficulty urinating, testicular pain and urgency.  Musculoskeletal: Negative.   Skin: Negative.  Negative for color change and pallor.  Allergic/Immunologic: Negative.  Negative for immunocompromised state.  Neurological: Negative.  Negative for dizziness, syncope, weakness, light-headedness, numbness and headaches.  Hematological: Negative.  Negative for adenopathy. Does not bruise/bleed easily.  Psychiatric/Behavioral: Negative.  Negative for confusion, dysphoric mood, self-injury and suicidal ideas. The patient is not nervous/anxious.   All other systems reviewed and are negative.         Objective:   Vitals:  Vitals:   01/16/19 0953  BP: 124/70  Pulse: (!) 57  Resp: 14  Temp: 98.1 F (36.7 C)  SpO2: 99%  Weight: 201 lb 6.4 oz (91.4 kg)  Height: 5\' 11"  (1.803 m)    Body mass index is 28.09 kg/m.  Physical Exam Vitals signs and nursing note reviewed.  Constitutional:      General: He is not in acute distress.    Appearance: Normal appearance. He is well-developed and normal weight. He is not ill-appearing, toxic-appearing or diaphoretic.  HENT:     Head: Normocephalic and atraumatic.     Jaw: No trismus.     Right Ear: Tympanic membrane, ear canal and external ear normal.     Left Ear: Tympanic membrane, ear canal and external  ear normal.     Nose: No mucosal edema or rhinorrhea.     Right Sinus: No maxillary sinus tenderness or frontal sinus tenderness.     Left Sinus: No maxillary sinus tenderness or frontal sinus tenderness.     Comments: Nasal discharge and mild congestion and edema Mild posterior OP erythema and visible post-nasal drip    Mouth/Throat:     Mouth: Mucous membranes are moist.     Pharynx: Oropharynx is clear. Uvula midline. No oropharyngeal exudate, posterior oropharyngeal erythema or uvula swelling.  Eyes:     General: Lids are normal. No  scleral icterus.       Right eye: No discharge.        Left eye: No discharge.     Conjunctiva/sclera: Conjunctivae normal.     Pupils: Pupils are equal, round, and reactive to light.  Neck:     Musculoskeletal: Normal range of motion and neck supple. No neck rigidity.     Trachea: Trachea and phonation normal. No tracheal deviation.  Cardiovascular:     Rate and Rhythm: Regular rhythm. Bradycardia present.     Pulses: Normal pulses.          Radial pulses are 2+ on the right side and 2+ on the left side.       Posterior tibial pulses are 2+ on the right side and 2+ on the left side.     Heart sounds: Normal heart sounds. No murmur. No friction rub. No gallop.   Pulmonary:     Effort: Pulmonary effort is normal.     Breath sounds: Normal breath sounds. No wheezing, rhonchi or rales.  Abdominal:     General: Bowel sounds are normal. There is no distension.     Palpations: Abdomen is soft.     Tenderness: There is no abdominal tenderness. There is no guarding or rebound.  Musculoskeletal: Normal range of motion.     Right lower leg: No edema.     Left lower leg: No edema.  Skin:    General: Skin is warm and dry.     Capillary Refill: Capillary refill takes less than 2 seconds.     Coloration: Skin is not jaundiced or pale.     Findings: No bruising or rash.  Neurological:     Mental Status: He is alert and oriented to person, place, and time.      Motor: No weakness.     Coordination: Coordination normal.     Gait: Gait normal.  Psychiatric:        Mood and Affect: Mood normal.        Speech: Speech normal.        Behavior: Behavior normal.      PHQ2/9: Depression screen [facility] 2/9 01/16/2019 10/16/2018 07/19/2018 02/22/2017 06/23/2016  Decreased Interest 0 0 0 0 0  Down, Depressed, Hopeless 0 0 0 0 0  PHQ - 2 Score 0 0 0 0 0  Altered sleeping 0 0 - - -  Tired, decreased energy 0 0 - - -  Change in appetite 0 0 - - -  Feeling bad or failure about yourself  0 0 - - -  Trouble concentrating 0 0 - - -  Moving slowly or fidgety/restless 0 0 - - -  Suicidal thoughts 0 0 - - -  PHQ-9 Score 0 0 - - -  Difficult doing work/chores Not difficult at all Not difficult at all - - -    Fall Risk: Fall Risk  01/16/2019 10/16/2018 07/19/2018 02/22/2017 06/23/2016  Falls in the past year? 0 0 0 No No  Number falls in past yr: 0 0 0 - -  Injury with Fall? 0 - 0 - -    Functional Status Survey: Is the patient deaf or have difficulty hearing?: No Does the patient have difficulty seeing, even when wearing glasses/contacts?: No Does the patient have difficulty concentrating, remembering, or making decisions?: No Does the patient have difficulty walking or climbing stairs?: No Does the patient have difficulty dressing or bathing?: No Does the patient have difficulty doing errands alone such as visiting  a doctor's office or shopping?: No   Assessment & Plan:    CPE completed today   Prostate cancer screening and PSA options (with potential risks and benefits of testing vs not testing) were discussed along with recent recs/guidelines, shared decision making and handout/information given to pt today   USPSTF grade A and B recommendations reviewed with patient; age-appropriate recommendations, preventive care, screening tests, etc discussed and encouraged; healthy living encouraged; see AVS for patient education given to patient   Discussed  importance of 150 minutes of physical activity weekly, AHA exercise recommendations given to pt in AVS/handout   Discussed importance of healthy diet:  eating lean meats and proteins, avoiding trans fats and saturated fats, avoid simple sugars and excessive carbs in diet, eat 6 servings of fruit/vegetables daily and drink plenty of water and avoid sweet beverages.  DASH diet reviewed if pt has HTN   Recommended pt to do annual eye exam and routine dental exams/cleanings   Reviewed Health Maintenance:  Health Maintenance  Topic Date Due   PNA vac Low Risk Adult (2 of 2 - PPSV23) 02/22/2018   INFLUENZA VACCINE  08/01/2019 (Originally 12/02/2018)   Fecal DNA (Cologuard)  07/13/2019   TETANUS/TDAP  05/03/2020   Hepatitis C Screening  Completed  declines to get flu and PPV done today, wants to ask work   Immunizations: Immunization History  Administered Date(s) Administered   Influenza, High Dose Seasonal PF 02/22/2017   Pneumococcal Conjugate-13 02/22/2017   Td 01/01/2002, 05/03/2010     ICD-10-CM   1. Adult general medical exam  Z00.00    completed today  2. Screening for prostate cancer  Z12.5 PSA   shared decision making, wishes PSA test today  3. Screening for lipoid disorders  Z13.220 Lipid panel    COMPLETE METABOLIC PANEL WITH GFR   discussed ASCVD risk calculator, meds and diet changes for his health handouts given, he will likely require statin, pt wants to research meds  4. Benign prostatic hyperplasia with lower urinary tract symptoms, symptom details unspecified  N40.1    stable, on meds, no change  5. Bradycardia  R00.1    asx - hx of HR ranging high 50- 60's  6. Rhinitis, unspecified type  J31.0    trial xyzal       Delsa Grana, PA-C 01/16/19 10:47 AM  [facility]

## 2019-01-29 ENCOUNTER — Other Ambulatory Visit: Payer: Self-pay

## 2019-01-29 ENCOUNTER — Other Ambulatory Visit: Payer: Managed Care, Other (non HMO)

## 2019-01-29 DIAGNOSIS — Z13220 Encounter for screening for lipoid disorders: Secondary | ICD-10-CM | POA: Diagnosis not present

## 2019-01-29 DIAGNOSIS — Z125 Encounter for screening for malignant neoplasm of prostate: Secondary | ICD-10-CM | POA: Diagnosis not present

## 2019-01-30 ENCOUNTER — Other Ambulatory Visit: Payer: Self-pay | Admitting: Family Medicine

## 2019-01-30 ENCOUNTER — Encounter: Payer: Self-pay | Admitting: Family Medicine

## 2019-01-30 DIAGNOSIS — E7800 Pure hypercholesterolemia, unspecified: Secondary | ICD-10-CM

## 2019-01-30 LAB — LIPID PANEL
Chol/HDL Ratio: 3.5 ratio (ref 0.0–5.0)
Cholesterol, Total: 186 mg/dL (ref 100–199)
HDL: 53 mg/dL (ref >39)
LDL Chol Calc (NIH): 117 mg/dL — ABNORMAL HIGH (ref 0–99)
Triglycerides: 87 mg/dL (ref 0–149)
VLDL Cholesterol Cal: 16 mg/dL (ref 5–40)

## 2019-01-30 LAB — COMPREHENSIVE METABOLIC PANEL
ALT: 17 IU/L (ref 0–44)
AST: 19 IU/L (ref 0–40)
Albumin/Globulin Ratio: 1.6 (ref 1.2–2.2)
Albumin: 4.2 g/dL (ref 3.8–4.8)
Alkaline Phosphatase: 95 IU/L (ref 39–117)
BUN/Creatinine Ratio: 12 (ref 10–24)
BUN: 10 mg/dL (ref 8–27)
Bilirubin Total: 0.6 mg/dL (ref 0.0–1.2)
CO2: 22 mmol/L (ref 20–29)
Calcium: 9.4 mg/dL (ref 8.6–10.2)
Chloride: 102 mmol/L (ref 96–106)
Creatinine, Ser: 0.85 mg/dL (ref 0.76–1.27)
GFR calc Af Amer: 103 mL/min/{1.73_m2} (ref >59)
GFR calc non Af Amer: 90 mL/min/{1.73_m2} (ref >59)
Globulin, Total: 2.6 g/dL (ref 1.5–4.5)
Glucose: 89 mg/dL (ref 65–99)
Potassium: 4.2 mmol/L (ref 3.5–5.2)
Sodium: 138 mmol/L (ref 134–144)
Total Protein: 6.8 g/dL (ref 6.0–8.5)

## 2019-01-30 LAB — PSA: Prostate Specific Ag, Serum: 0.2 ng/mL (ref 0.0–4.0)

## 2019-01-30 MED ORDER — ROSUVASTATIN CALCIUM 10 MG PO TABS: 10.0000 mg | ORAL_TABLET | Freq: Every day | ORAL | 3 refills | Status: DC

## 2019-05-01 ENCOUNTER — Ambulatory Visit: Payer: Managed Care, Other (non HMO) | Admitting: Family Medicine

## 2019-05-01 ENCOUNTER — Other Ambulatory Visit: Payer: Self-pay

## 2019-05-01 ENCOUNTER — Encounter: Payer: Self-pay | Admitting: Family Medicine

## 2019-05-01 VITALS — BP 124/76 | HR 84 | Temp 98.1°F | Resp 14 | Ht 71.0 in | Wt 204.8 lb

## 2019-05-01 DIAGNOSIS — Z23 Encounter for immunization: Secondary | ICD-10-CM

## 2019-05-01 DIAGNOSIS — N401 Enlarged prostate with lower urinary tract symptoms: Secondary | ICD-10-CM

## 2019-05-01 DIAGNOSIS — E7800 Pure hypercholesterolemia, unspecified: Secondary | ICD-10-CM

## 2019-05-01 NOTE — Progress Notes (Signed)
Name: Jeremiah Beck   MRN: [id number]    DOB: 1951/02/15   Date:05/01/2019       Progress Note  Chief Complaint  Patient presents with  . Follow-up  . Benign Prostatic Hypertrophy  . Hyperlipidemia    never started med, wanted to work on diet first     Subjective:   Jeremiah Beck is a 68 y.o. male, presents to clinic for routine follow up on the conditions listed above.  Pt did his physical about 3 months ago and LDL was found to be high. Hyperlipidemia: I did prescribe crestor and asked pt to follow up in 3 months, pt presents today and states he never started medication, but wanted to work on his diet first. Last Lipids: Lab Results  Component Value Date   CHOL 186 01/29/2019   HDL 53 01/29/2019   LDLCALC 117 (H) 01/29/2019   TRIG 87 01/29/2019   CHOLHDL 3.5 01/29/2019  The 10-year ASCVD risk score Mikey Bussing DC Jr., et al., 2013) is: 15%   Values used to calculate the score:     Age: 28 years     Sex: Male     Is Non-Hispanic African American: No     Diabetic: No     Tobacco smoker: No     Systolic Blood Pressure: [id number] mmHg     Is BP treated: No     HDL Cholesterol: 53 mg/dL     Total Cholesterol: 186 mg/dL - Current Diet:  Improved diet, cut out cookies, increased fruits - Denies: Chest pain, shortness of breath, myalgias. - Documented aortic atherosclerosis? No - Risk factors for atherosclerosis: hypercholesterolemia Discussed at length atherosclerosis, cardiovascular disease and benefits and method of action of statin medications including possible side effects, gave him multiple handouts and described at length the benefit beyond simply lowering cholesterol levels.  Benign Prostatic Hypertrophy:  Patient complains of lower urinary tract symptoms. Patient reports mild symptoms of BPH. Onset of symptoms was a few years ago and was gradual in onset. His AUA Symptom Score is, 3/35 manifested as obstructive symptoms including incomplete emptying. He has no hx of  prostate cancer.  He reports frequency and nocturia one time a night.  He denies incomplete emptying, straining, urgency and weak stream.  He denies no complicating symptoms.  IPSS Questionnaire (AUA-7): Over the past month.   1)  How often have you had a sensation of not emptying your bladder completely after you finish urinating?  1 - Less than 1 time in 5  2)  How often have you had to urinate again less than two hours after you finished urinating? 1 - Less than 1 time in 5  3)  How often have you found you stopped and started again several times when you urinated?  0 - Not at all  4) How difficult have you found it to postpone urination?  0 - Not at all  5) How often have you had a weak urinary stream?  0 - Not at all  6) How often have you had to push or strain to begin urination?  0 - Not at all  7) How many times did you most typically get up to urinate from the time you went to bed until the time you got up in the morning?  1 - 1 time  Total score:  0-7 mildly symptomatic   8-19 moderately symptomatic   20-35 severely symptomatic   Pt will get pneumococcal vaccination today   Patient Active Problem  List   Diagnosis Date Noted  . Pure hypercholesterolemia 01/16/2019  . Degenerative arthritis of cervical spine 02/23/2017  . Benign prostatic hyperplasia   . ED (erectile dysfunction)     Past Surgical History:  Procedure Laterality Date  . HERNIA REPAIR    . SKIN CANCER EXCISION     removed from face    Family History  Problem Relation Age of Onset  . Cancer Father   . Alcohol abuse Father   . Cancer Mother   . Varicose Veins Paternal Grandmother     Social History   Socioeconomic History  . Marital status: Divorced    Spouse name: Not on file  . Number of children: 0  . Years of education: Not on file  . Highest education level: Some college, no degree  Occupational History  . Not on file  Tobacco Use  . Smoking status: Never Smoker  . Smokeless tobacco:  Never Used  Substance and Sexual Activity  . Alcohol use: No  . Drug use: No  . Sexual activity: Not Currently  Other Topics Concern  . Not on file  Social History Narrative  . Not on file   Social Determinants of Health   Financial Resource Strain: Low Risk   . Difficulty of Paying Living Expenses: Not hard at all  Food Insecurity: No Food Insecurity  . Worried About Charity fundraiser in the Last Year: Never true  . Ran Out of Food in the Last Year: Never true  Transportation Needs: No Transportation Needs  . Lack of Transportation (Medical): No  . Lack of Transportation (Non-Medical): No  Physical Activity: Sufficiently Active  . Days of Exercise per Week: 5 days  . Minutes of Exercise per Session: 150+ min  Stress: Stress Concern Present  . Feeling of Stress : To some extent  Social Connections: Moderately Isolated  . Frequency of Communication with Friends and Family: Twice a week  . Frequency of Social Gatherings with Friends and Family: Once a week  . Attends Religious Services: Never  . Active Member of Clubs or Organizations: No  . Attends Archivist Meetings: Never  . Marital Status: Divorced  Human resources officer Violence: Not At Risk  . Fear of Current or Ex-Partner: No  . Emotionally Abused: No  . Physically Abused: No  . Sexually Abused: No     Current Outpatient Medications:  .  aspirin EC 81 MG tablet, Take 81 mg by mouth daily., Disp: , Rfl:  .  finasteride (PROSCAR) 5 MG tablet, Take 1 tablet (5 mg total) by mouth daily., Disp: 90 tablet, Rfl: 3 .  Multiple Vitamins-Minerals (ONE-A-DAY MENS 50+ ADVANTAGE) TABS, Take by mouth., Disp: , Rfl:  .  Turmeric 500 MG CAPS, Take by mouth., Disp: , Rfl:  .  rosuvastatin (CRESTOR) 10 MG tablet, Take 1 tablet (10 mg total) by mouth at bedtime. (Patient not taking: Reported on 05/01/2019), Disp: 90 tablet, Rfl: 3  No Known Allergies  I personally reviewed active problem list, medication list, allergies,  family history, social history, health maintenance, notes from last encounter, lab results, imaging with the patient/caregiver today.   Review of Systems  Constitutional: Negative.   HENT: Negative.   Eyes: Negative.   Respiratory: Negative.   Cardiovascular: Negative.   Gastrointestinal: Negative.   Endocrine: Negative.   Genitourinary: Negative.   Musculoskeletal: Negative.   Skin: Negative.   Allergic/Immunologic: Negative.   Neurological: Negative.   Hematological: Negative.   Psychiatric/Behavioral: Negative.  All other systems reviewed and are negative.    Objective:    Vitals:   05/01/19 0835  BP: 124/76  Pulse: 84  Resp: 14  Temp: 98.1 F (36.7 C)  SpO2: 96%  Weight: 204 lb 12.8 oz (92.9 kg)  Height: 5\' 11"  (1.803 m)    Body mass index is 28.56 kg/m.  Physical Exam Vitals and nursing note reviewed.  Constitutional:      General: He is not in acute distress.    Appearance: Normal appearance. He is well-developed. He is not ill-appearing, toxic-appearing or diaphoretic.     Interventions: Face mask in place.  HENT:     Head: Normocephalic and atraumatic.     Jaw: No trismus.     Right Ear: External ear normal.     Left Ear: External ear normal.  Eyes:     General: Lids are normal. No scleral icterus.    Conjunctiva/sclera: Conjunctivae normal.     Pupils: Pupils are equal, round, and reactive to light.  Neck:     Trachea: Trachea and phonation normal. No tracheal deviation.  Cardiovascular:     Rate and Rhythm: Normal rate and regular rhythm.     Pulses: Normal pulses.          Radial pulses are 2+ on the right side and 2+ on the left side.       Posterior tibial pulses are 2+ on the right side and 2+ on the left side.     Heart sounds: Normal heart sounds. No murmur. No friction rub. No gallop.   Pulmonary:     Effort: Pulmonary effort is normal. No respiratory distress.     Breath sounds: Normal breath sounds. No stridor. No wheezing, rhonchi  or rales.  Abdominal:     General: Bowel sounds are normal. There is no distension.     Palpations: Abdomen is soft.     Tenderness: There is no abdominal tenderness. There is no guarding or rebound.  Musculoskeletal:        General: Normal range of motion.     Cervical back: Normal range of motion and neck supple.     Right lower leg: No edema.     Left lower leg: No edema.  Skin:    General: Skin is warm and dry.     Capillary Refill: Capillary refill takes less than 2 seconds.     Coloration: Skin is not jaundiced.     Findings: No rash.     Nails: There is no clubbing.  Neurological:     Mental Status: He is alert.     Cranial Nerves: No dysarthria or facial asymmetry.     Motor: No tremor or abnormal muscle tone.     Gait: Gait normal.  Psychiatric:        Mood and Affect: Mood normal.        Speech: Speech normal.        Behavior: Behavior normal. Behavior is cooperative.      No results found for this or any previous visit (from the past 2160 hour(s)).  PHQ2/9: Depression screen [facility] 2/9 05/01/2019 01/16/2019 10/16/2018 07/19/2018 02/22/2017  Decreased Interest 0 0 0 0 0  Down, Depressed, Hopeless 0 0 0 0 0  PHQ - 2 Score 0 0 0 0 0  Altered sleeping 0 0 0 - -  Tired, decreased energy 0 0 0 - -  Change in appetite 0 0 0 - -  Feeling bad or failure about  yourself  0 0 0 - -  Trouble concentrating 0 0 0 - -  Moving slowly or fidgety/restless 0 0 0 - -  Suicidal thoughts 0 0 0 - -  PHQ-9 Score 0 0 0 - -  Difficult doing work/chores Not difficult at all Not difficult at all Not difficult at all - -    phq 9 is negative reviewed  Fall Risk: Fall Risk  05/01/2019 01/16/2019 10/16/2018 07/19/2018 02/22/2017  Falls in the past year? 0 0 0 0 No  Number falls in past yr: 0 0 0 0 -  Injury with Fall? 0 0 - 0 -   Functional Status Survey: Is the patient deaf or have difficulty hearing?: No Does the patient have difficulty seeing, even when wearing glasses/contacts?:  No Does the patient have difficulty concentrating, remembering, or making decisions?: No Does the patient have difficulty walking or climbing stairs?: No Does the patient have difficulty dressing or bathing?: No Does the patient have difficulty doing errands alone such as visiting a doctor's office or shopping?: No   Assessment & Plan:   1. Pure hypercholesterolemia Discussed at length ASCVD risk of 15% Prescribed crestor about 3 months ago, this was to recheck labs with new med, but he did not start, reviewed risk/statin meds, handout given, pt wishes to recheck labs and is slightly hesitant about statins, but seems willing to try since we spent a good amount of time discussing why would want him to take to decrease risk.    2. Benign prostatic hyperplasia with lower urinary tract symptoms, symptom details unspecified Sx mild, tolerating   3. Need for pneumococcal vaccination - Pneumococcal polysaccharide vaccine 23-valent greater than or equal to 2yo subcutaneous/IM    Return in about 6 months (around 10/30/2019).   Delsa Grana, PA-C 05/01/19 8:41 AM

## 2019-05-01 NOTE — Patient Instructions (Signed)

## 2019-05-02 ENCOUNTER — Other Ambulatory Visit: Payer: Self-pay | Admitting: Family Medicine

## 2019-05-02 DIAGNOSIS — E7800 Pure hypercholesterolemia, unspecified: Secondary | ICD-10-CM

## 2019-05-02 LAB — LIPID PANEL
Cholesterol: 203 mg/dL — ABNORMAL HIGH (ref <200)
HDL: 57 mg/dL (ref >=40)
LDL Cholesterol (Calc): 129 mg/dL (calc) — ABNORMAL HIGH
Non-HDL Cholesterol (Calc): 146 mg/dL (calc) — ABNORMAL HIGH (ref <130)
Total CHOL/HDL Ratio: 3.6 (calc) (ref <5.0)
Triglycerides: 76 mg/dL (ref <150)

## 2019-05-02 LAB — COMPLETE METABOLIC PANEL WITH GFR
AG Ratio: 1.5 (calc) (ref 1.0–2.5)
ALT: 16 U/L (ref 9–46)
AST: 20 U/L (ref 10–35)
Albumin: 4.3 g/dL (ref 3.6–5.1)
Alkaline phosphatase (APISO): 79 U/L (ref 35–144)
BUN: 12 mg/dL (ref 7–25)
CO2: 29 mmol/L (ref 20–32)
Calcium: 9.3 mg/dL (ref 8.6–10.3)
Chloride: 104 mmol/L (ref 98–110)
Creat: 0.91 mg/dL (ref 0.70–1.25)
GFR, Est African American: 99 mL/min/{1.73_m2} (ref >=60)
GFR, Est Non African American: 86 mL/min/{1.73_m2} (ref >=60)
Globulin: 2.8 g/dL (calc) (ref 1.9–3.7)
Glucose, Bld: 81 mg/dL (ref 65–99)
Potassium: 4.6 mmol/L (ref 3.5–5.3)
Sodium: 139 mmol/L (ref 135–146)
Total Bilirubin: 0.9 mg/dL (ref 0.2–1.2)
Total Protein: 7.1 g/dL (ref 6.1–8.1)

## 2019-05-02 MED ORDER — ROSUVASTATIN CALCIUM 10 MG PO TABS: 10.0000 mg | ORAL_TABLET | Freq: Every day | ORAL | 3 refills | Status: DC

## 2019-05-29 ENCOUNTER — Ambulatory Visit: Payer: Self-pay | Admitting: Family Medicine

## 2019-05-29 NOTE — Telephone Encounter (Signed)
Pt called for a reminder of what his cholesterol was in December. Values given, pt verbalized understanding.

## 2019-07-03 ENCOUNTER — Ambulatory Visit: Payer: Self-pay | Admitting: Family Medicine

## 2019-10-25 ENCOUNTER — Telehealth: Payer: Self-pay

## 2019-10-25 DIAGNOSIS — N401 Enlarged prostate with lower urinary tract symptoms: Secondary | ICD-10-CM

## 2019-10-25 MED ORDER — FINASTERIDE 5 MG PO TABS: 5.0000 mg | ORAL_TABLET | Freq: Every day | ORAL | 0 refills | Status: DC

## 2019-10-25 NOTE — Telephone Encounter (Signed)
Pt needs f/u appt

## 2019-10-25 NOTE — Telephone Encounter (Signed)
Already has followup

## 2019-10-26 ENCOUNTER — Ambulatory Visit: Payer: Managed Care, Other (non HMO) | Admitting: Physician Assistant

## 2019-10-26 ENCOUNTER — Other Ambulatory Visit: Payer: Self-pay

## 2019-10-26 VITALS — BP 136/72 | HR 56 | Temp 97.8°F | Resp 15 | Ht 71.0 in | Wt 204.0 lb

## 2019-10-26 DIAGNOSIS — H01004 Unspecified blepharitis left upper eyelid: Secondary | ICD-10-CM

## 2019-10-26 MED ORDER — ERYTHROMYCIN 5 MG/GM OP OINT: TOPICAL_OINTMENT | Freq: Four times a day (QID) | OPHTHALMIC | Status: DC

## 2019-10-26 MED ORDER — ERYTHROMYCIN 5 MG/GM OP OINT: 1.0000 "application " | TOPICAL_OINTMENT | OPHTHALMIC | 0 refills | Status: AC

## 2019-10-26 NOTE — Addendum Note (Signed)
Addended by: Jan Fireman on: 10/26/2019 01:26 PM   Modules accepted: Orders

## 2019-10-26 NOTE — Patient Instructions (Signed)
Allergic Rhinitis, Adult Allergic rhinitis is a reaction to allergens in the air. Allergens are tiny specks (particles) in the air that cause your body to have an allergic reaction. This condition cannot be passed from person to person (is not contagious). Allergic rhinitis cannot be cured, but it can be controlled. There are two types of allergic rhinitis:  Seasonal. This type is also called hay fever. It happens only during certain times of the year.  Perennial. This type can happen at any time of the year. What are the causes? This condition may be caused by:  Pollen from grasses, trees, and weeds.  House dust mites.  Pet dander.  Mold. What are the signs or symptoms? Symptoms of this condition include:  Sneezing.  Runny or stuffy nose (nasal congestion).  A lot of mucus in the back of the throat (postnasal drip).  Itchy nose.  Tearing of the eyes.  Trouble sleeping.  Being sleepy during day. How is this treated? There is no cure for this condition. You should avoid things that trigger your symptoms (allergens). Treatment can help to relieve symptoms. This may include:  Medicines that block allergy symptoms, such as antihistamines. These may be given as a shot, nasal spray, or pill.  Shots that are given until your body becomes less sensitive to the allergen (desensitization).  Stronger medicines, if all other treatments have not worked. Follow these instructions at home: Avoiding allergens   Find out what you are allergic to. Common allergens include smoke, dust, and pollen.  Avoid them if you can. These are some of the things that you can do to avoid allergens: ? Replace carpet with wood, tile, or vinyl flooring. Carpet can trap dander and dust. ? Clean any mold found in the home. ? Do not smoke. Do not allow smoking in your home. ? Change your heating and air conditioning filter at least once a month. ? During allergy season:  Keep windows closed as much as  you can. If possible, use air conditioning when there is a lot of pollen in the air.  Use a special filter for allergies with your furnace and air conditioner.  Plan outdoor activities when pollen counts are lowest. This is usually during the early morning or evening hours.  If you do go outdoors when pollen count is high, wear a special mask for people with allergies.  When you come indoors, take a shower and change your clothes before sitting on furniture or bedding. General instructions  Do not use fans in your home.  Do not hang clothes outside to dry.  Wear sunglasses to keep pollen out of your eyes.  Wash your hands right away after you touch household pets.  Take over-the-counter and prescription medicines only as told by your doctor.  Keep all follow-up visits as told by your doctor. This is important. Contact a doctor if:  You have a fever.  You have a cough that does not go away (is persistent).  You start to make whistling sounds when you breathe (wheeze).  Your symptoms do not get better with treatment.  You have thick fluid coming from your nose.  You start to have nosebleeds. Get help right away if:  Your tongue or your lips are swollen.  You have trouble breathing.  You feel dizzy or you feel like you are going to pass out (faint).  You have cold sweats. Summary  Allergic rhinitis is a reaction to allergens in the air.  This condition may be   caused by allergens. These include pollen, dust mites, pet dander, and mold.  Symptoms include a runny, itchy nose, sneezing, or tearing eyes. You may also have trouble sleeping or feel sleepy during the day.  Treatment includes taking medicines and avoiding allergens. You may also get shots or take stronger medicines.  Get help if you have a fever or a cough that does not stop. Get help right away if you are short of breath. This information is not intended to replace advice given to you by your health care  provider. Make sure you discuss any questions you have with your health care provider. Document Revised: 08/08/2018 Document Reviewed: 11/08/2017 Elsevier Patient Education  [place]. Blepharitis Blepharitis is swelling of the eyelids. Symptoms may include:  Reddish, scaly skin around the scalp and eyebrows.  Burning or itching of the eyelids.  Fluid coming from the eye at night. This causes the eyelashes to stick together in the morning.  Eyelashes that fall out.  Being sensitive to light. Follow these instructions at home: Pay attention to any changes in how you look or feel. Tell your health care provider about any changes. Follow these instructions to help with your condition: Keeping clean   Wash your hands often.  Wash your eyelids with warm water, or wash them with warm water that is mixed with little bit of baby shampoo. Do this 2 or more times per day.  Wash your face and eyebrows at least once a day.  Use a clean towel each time you dry your eyelids. Do not use the towel to clean or dry other areas of your body. Do not share your towel with anyone. General instructions  Avoid wearing makeup until you get better. Do not share makeup with anyone.  Avoid rubbing your eyes.  Put a warm compress on your eyes 2 times per day for 10 minutes at a time, or as told by your doctor.  If you were given antibiotics in the form of creams or eye drops, use the medicine as told by your doctor. Do not stop using the medicine even if you feel better.  Keep all follow-up visits as told by your doctor. This is important. Contact a doctor if:  Your eyelids feel hot.  You have blisters on your eyelids.  You have a rash on your eyelids.  The swelling does not go away in 2-4 days.  The swelling gets worse. Get help right away if:  You have pain that gets worse.  You have pain that spreads to other parts of your face.  You have redness that gets worse.  You have  redness that spreads to other parts of your face.  Your vision changes.  You have pain when you look at lights or things that move.  You have a fever. Summary  Blepharitis is swelling of the eyelids.  Pay attention to any changes in how your eyes look or feel. Tell your doctor about any changes.  Follow home care instructions as told by your doctor. Wash your hands often. Avoid wearing makeup. Do not rub your eyes.  Use warm compresses, creams, or eye drops as told by your doctor.  Let your doctor know if you have changes in vision, blisters or rash on eyelids, pain that spreads to your face, or warmth on your eyelids. This information is not intended to replace advice given to you by your health care provider. Make sure you discuss any questions you have with your health care  provider. Document Revised: 10/17/2017 Document Reviewed: 10/17/2017 Elsevier Patient Education  [place].

## 2019-10-26 NOTE — Progress Notes (Signed)
   Subjective:    Patient ID: Jeremiah Beck, male    DOB: 26-Aug-1950, 69 y.o.   MRN: [id number]  HPI  69 yo M presents with left eyelid swelling and tenderness over past 2 days. No visual changes, no injection, no discharge from the eye. Denies URI . On questioning has itchy eye, frequent sneezing and post nasal drip during summer months. To this point has not  agreed to take allergy Rx OTC. No malaise, no fever, good appetite, sleeping well.  Has opted not to take Covid vaccine " because nobody at work is doing it.  Discussed.  Taking turmeric and fish oil for cholesterol - defers Crestor Rx  on record,   Review of Systems As noted    Objective:   Physical Exam Vitals and nursing note reviewed.  Constitutional:      Appearance: Normal appearance.  HENT:     Head: Normocephalic and atraumatic.     Nose: Nose normal.     Mouth/Throat:     Mouth: Mucous membranes are moist.  Eyes:     General: No scleral icterus.       Right eye: No discharge.        Left eye: No discharge.     Extraocular Movements: Extraocular movements intact.     Conjunctiva/sclera: Conjunctivae normal.     Comments: Left upper eyelid, swollen, moderate Lashes without exudate Skin peripheral to eye mildly irritated Suspect rubbing Conjunctiva clear   PERRL  No discharge  Visual changes denied, no discomfort     Cardiovascular:     Rate and Rhythm: Normal rate and regular rhythm.     Pulses: Normal pulses.  Pulmonary:     Effort: Pulmonary effort is normal.     Breath sounds: Normal breath sounds.  Musculoskeletal:     Cervical back: Normal range of motion and neck supple.  Neurological:     Mental Status: He is alert and oriented to person, place, and time.  Psychiatric:        Mood and Affect: Mood normal.        Behavior: Behavior normal.       Assessment & Plan:    Blepharitis , left  Rx Erythromycin ophthalmic ointment q 4 H while awake Clean hands and self-technique taught, lives  alone Warm packs to left eye TID/QID Precaution not hot !  keep gently warm Urged to keep hands away from eyes- frequently adjust mask and touches face and eyes- taught to flex noseband on mask to adapt to glasses Has appointment already scheduled with PCP /PA-C on Tuesday and will request re-eval  at that time.  Present more quickly if exacerbation /concerns  Questions fielded , recommendations made re: Covid vaccination- information given for local sites- discuss with PCP as may be available at private  office which would be more comfortable for him if he decides to proceed

## 2019-10-30 ENCOUNTER — Ambulatory Visit: Payer: Managed Care, Other (non HMO) | Admitting: Family Medicine

## 2019-11-07 ENCOUNTER — Ambulatory Visit: Payer: Managed Care, Other (non HMO) | Admitting: Family Medicine

## 2019-11-26 ENCOUNTER — Other Ambulatory Visit: Payer: Self-pay | Admitting: Family Medicine

## 2019-11-26 DIAGNOSIS — N401 Enlarged prostate with lower urinary tract symptoms: Secondary | ICD-10-CM

## 2019-11-29 ENCOUNTER — Other Ambulatory Visit: Payer: Self-pay | Admitting: Family Medicine

## 2019-11-29 DIAGNOSIS — N401 Enlarged prostate with lower urinary tract symptoms: Secondary | ICD-10-CM

## 2019-11-29 MED ORDER — FINASTERIDE 5 MG PO TABS: 5.0000 mg | ORAL_TABLET | Freq: Every day | ORAL | 0 refills | Status: DC

## 2019-11-29 NOTE — Telephone Encounter (Signed)
Copied from [place] [id number]. Topic: Quick Communication - Rx Refill/Question >> Nov 29, 2019  3:53 PM Leward Quan A wrote: Medication: finasteride (PROSCAR) 5 MG tablet  Patient completely out  Has the patient contacted their pharmacy? Yes.   (Agent: If no, request that the patient contact the pharmacy for the refill.) (Agent: If yes, when and what did the pharmacy advise?)  Preferred Pharmacy (with phone number or street name): [facility] - [place], [place].  Phone:  [phone] Fax:  [phone]     Agent: Please be advised that RX refills may take up to 3 business days. We ask that you follow-up with your pharmacy.

## 2019-12-10 DIAGNOSIS — Z85828 Personal history of other malignant neoplasm of skin: Secondary | ICD-10-CM

## 2019-12-10 HISTORY — DX: Personal history of other malignant neoplasm of skin: Z85.828

## 2020-01-30 ENCOUNTER — Other Ambulatory Visit: Payer: Self-pay | Admitting: Family Medicine

## 2020-01-30 DIAGNOSIS — N401 Enlarged prostate with lower urinary tract symptoms: Secondary | ICD-10-CM

## 2020-03-03 ENCOUNTER — Other Ambulatory Visit: Payer: Self-pay | Admitting: Family Medicine

## 2020-03-03 DIAGNOSIS — N401 Enlarged prostate with lower urinary tract symptoms: Secondary | ICD-10-CM

## 2020-03-03 MED ORDER — FINASTERIDE 5 MG PO TABS: 5.0000 mg | ORAL_TABLET | Freq: Every day | ORAL | 0 refills | Status: DC
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
## 2020-03-03 NOTE — Telephone Encounter (Signed)
PT need a refill  finasteride (PROSCAR) 5 MG tablet [[id number]]  [place], [place] - [place] [address]  Phone: [phone] Fax: [phone]

## 2020-06-05 ENCOUNTER — Other Ambulatory Visit: Payer: Self-pay | Admitting: Family Medicine

## 2020-06-05 DIAGNOSIS — N401 Enlarged prostate with lower urinary tract symptoms: Secondary | ICD-10-CM

## 2020-06-05 NOTE — Telephone Encounter (Signed)
Was not able to lvm. Leisa sent in a 30day supply however pt needing an appointment for additional refills

## 2020-08-07 ENCOUNTER — Telehealth: Payer: Self-pay

## 2020-08-07 NOTE — Telephone Encounter (Signed)
Pt will need appt for anymore refills

## 2020-08-08 ENCOUNTER — Telehealth: Payer: Self-pay

## 2020-08-08 NOTE — Telephone Encounter (Signed)
Copied from [place] [id number]. Topic: General - Other >> Aug 08, 2020  2:53 PM Beck, Jeremiah wrote: Reason for CRM: Pt questions if it is time for him to have labs. Pt stated he missed an appt and he feels like it is time for him to have the blood work. Pt requests call back

## 2020-08-11 NOTE — Telephone Encounter (Signed)
Patient notified he need appointment has not been seen since 2020

## 2020-08-11 NOTE — Telephone Encounter (Signed)
Please call and make him appointment

## 2020-08-13 ENCOUNTER — Encounter: Payer: Self-pay | Admitting: Family Medicine

## 2020-08-13 ENCOUNTER — Other Ambulatory Visit: Payer: Self-pay

## 2020-08-13 ENCOUNTER — Ambulatory Visit (INDEPENDENT_AMBULATORY_CARE_PROVIDER_SITE_OTHER): Payer: Medicare HMO | Admitting: Family Medicine

## 2020-08-13 VITALS — BP 132/76 | HR 67 | Temp 98.0°F | Resp 18 | Ht 71.0 in | Wt 208.3 lb

## 2020-08-13 DIAGNOSIS — N401 Enlarged prostate with lower urinary tract symptoms: Secondary | ICD-10-CM

## 2020-08-13 DIAGNOSIS — Z5181 Encounter for therapeutic drug level monitoring: Secondary | ICD-10-CM

## 2020-08-13 DIAGNOSIS — E7800 Pure hypercholesterolemia, unspecified: Secondary | ICD-10-CM | POA: Diagnosis not present

## 2020-08-13 DIAGNOSIS — Z1211 Encounter for screening for malignant neoplasm of colon: Secondary | ICD-10-CM

## 2020-08-13 DIAGNOSIS — Z125 Encounter for screening for malignant neoplasm of prostate: Secondary | ICD-10-CM

## 2020-08-13 DIAGNOSIS — R399 Unspecified symptoms and signs involving the genitourinary system: Secondary | ICD-10-CM

## 2020-08-13 MED ORDER — FINASTERIDE 5 MG PO TABS: 5.0000 mg | ORAL_TABLET | Freq: Every day | ORAL | 3 refills | Status: DC

## 2020-08-13 NOTE — Progress Notes (Signed)
Name: Jeremiah Beck   MRN: [id number]    DOB: 11/24/1950   Date:08/13/2020       Progress Note  Chief Complaint  Patient presents with  . Foot Pain    Left foot/comes and goes "needle/sting"  . Medication Refill  . Urology referral     Subjective:   Jeremiah Beck is a 70 y.o. male, presents to clinic for routine f/up  HLD - not on meds, prescribed crestor in the past but he didn't take it and preferred to work on diet Lab Results  Component Value Date   CHOL 203 (H) 05/01/2019   HDL 57 05/01/2019   [place] 129 (H) 05/01/2019   TRIG 76 05/01/2019   CHOLHDL 3.6 05/01/2019   The 10-year ASCVD risk score Jeremiah Beck., et al., 2013) is: 18.1%   Values used to calculate the score:     Age: 40 years     Sex: Male     Is Non-Hispanic African American: No     Diabetic: No     Tobacco smoker: No     Systolic Blood Pressure: 220 mmHg     Is BP treated: No     HDL Cholesterol: 57 mg/dL     Total Cholesterol: 203 mg/dL  BPH? On finasteride 5 mg  IPSS    Row Name 08/13/20 1400         International Prostate Symptom Score   How often have you had the sensation of not emptying your bladder? Almost always     How often have you had to urinate less than every two hours? Almost always     How often have you found you stopped and started again several times when you urinated? Not at All     How often have you found it difficult to postpone urination? Almost always     How often have you had a weak urinary stream? Not at All     How often have you had to strain to start urination? Less than 1 in 5 times     How many times did you typically get up at night to urinate? 1 Time     Total IPSS Score 17           Quality of Life due to urinary symptoms   If you were to spend the rest of your life with your urinary condition just the way it is now how would you feel about that? Mostly Disatisfied           PT on meds for prostate for 30+ years but LUTS newer and worse x a few  weeks, esp urgency and frequency, dribbling He denies abdominal pain, flank pain, pain in scrotum or testicles, hematuria  Some new meds on chart   Doesn't know family hx but both parents had cancer        Current Outpatient Medications:  .  aspirin EC 81 MG tablet, Take 81 mg by mouth daily., Disp: , Rfl:  .  finasteride (PROSCAR) 5 MG tablet, TAKE 1 TABLET BY MOUTH ONCE DAILY, Disp: 30 tablet, Rfl: 1 .  Multiple Vitamins-Minerals (ONE-A-DAY MENS 50+ ADVANTAGE) TABS, Take by mouth., Disp: , Rfl:  .  Omega-3 Fatty Acids (FISH OIL) 1000 MG CAPS, Take by mouth., Disp: , Rfl:  .  Turmeric 500 MG CAPS, Take by mouth., Disp: , Rfl:  .  ketoconazole (NIZORAL) 2 % cream, Apply topically. (Patient not taking: Reported on 08/13/2020), Disp: , Rfl:  .  rosuvastatin (CRESTOR) 10 MG tablet, Take 1 tablet (10 mg total) by mouth at bedtime. (Patient not taking: No sig reported), Disp: 90 tablet, Rfl: 3 .  terbinafine (LAMISIL) 250 MG tablet, Take 250 mg by mouth daily. (Patient not taking: Reported on 08/13/2020), Disp: , Rfl:   Patient Active Problem List   Diagnosis Date Noted  . Pure hypercholesterolemia 01/16/2019  . Degenerative arthritis of cervical spine 02/23/2017  . Benign prostatic hyperplasia   . ED (erectile dysfunction)     Past Surgical History:  Procedure Laterality Date  . HERNIA REPAIR    . SKIN CANCER EXCISION     removed from face    Family History  Problem Relation Age of Onset  . Cancer Father   . Alcohol abuse Father   . Cancer Mother   . Varicose Veins Paternal Grandmother     Social History   Tobacco Use  . Smoking status: Never Smoker  . Smokeless tobacco: Never Used  Vaping Use  . Vaping Use: Never used  Substance Use Topics  . Alcohol use: No  . Drug use: No     No Known Allergies  Health Maintenance  Topic Date Due  . Fecal DNA (Cologuard)  07/13/2019  . TETANUS/TDAP  05/03/2020  . COVID-19 Vaccine (1) 08/29/2020 (Originally 04/14/1956)  .  INFLUENZA VACCINE  12/01/2020  . Hepatitis C Screening  Completed  . PNA vac Low Risk Adult  Completed  . HPV VACCINES  Aged Out    Chart Review Today: I personally reviewed active problem list, medication list, allergies, family history, social history, health maintenance, notes from last encounter, lab results, imaging with the patient/caregiver today.   Review of Systems  Constitutional: Negative.   HENT: Negative.   Eyes: Negative.   Respiratory: Negative.   Cardiovascular: Negative.   Gastrointestinal: Negative.   Endocrine: Negative.   Genitourinary: Negative.   Musculoskeletal: Negative.   Skin: Negative.   Allergic/Immunologic: Negative.   Neurological: Negative.   Hematological: Negative.   Psychiatric/Behavioral: Negative.   All other systems reviewed and are negative.    Objective:   Vitals:   08/13/20 1352  BP: 132/76  Pulse: 67  Resp: 18  Temp: 98 F (36.7 C)  TempSrc: Oral  SpO2: 98%  Weight: 208 lb 4.8 oz (94.5 kg)  Height: 5\' 11"  (1.803 m)    Body mass index is 29.05 kg/m.  Physical Exam Vitals and nursing note reviewed.  Constitutional:      General: He is not in acute distress.    Appearance: Normal appearance. He is well-developed. He is not ill-appearing, toxic-appearing or diaphoretic.     Interventions: Face mask in place.  HENT:     Head: Normocephalic and atraumatic.     Jaw: No trismus.     Right Ear: External ear normal.     Left Ear: External ear normal.  Eyes:     General: Lids are normal. No scleral icterus.       Right eye: No discharge.        Left eye: No discharge.     Conjunctiva/sclera: Conjunctivae normal.  Neck:     Trachea: Trachea and phonation normal. No tracheal deviation.  Cardiovascular:     Rate and Rhythm: Normal rate and regular rhythm.     Pulses: Normal pulses.          Radial pulses are 2+ on the right side and 2+ on the left side.       Posterior tibial pulses  are 2+ on the right side and 2+ on the  left side.     Heart sounds: Normal heart sounds. No murmur heard. No friction rub. No gallop.   Pulmonary:     Effort: Pulmonary effort is normal. No respiratory distress.     Breath sounds: Normal breath sounds. No stridor. No wheezing, rhonchi or rales.  Abdominal:     General: Bowel sounds are normal. There is no distension.     Palpations: Abdomen is soft.  Musculoskeletal:     Right lower leg: No edema.     Left lower leg: No edema.  Skin:    General: Skin is warm and dry.     Coloration: Skin is not jaundiced.     Findings: No rash.     Nails: There is no clubbing.  Neurological:     Mental Status: He is alert. Mental status is at baseline.     Cranial Nerves: No dysarthria or facial asymmetry.     Motor: No tremor or abnormal muscle tone.     Gait: Gait normal.  Psychiatric:        Mood and Affect: Mood normal.        Speech: Speech normal.        Behavior: Behavior normal. Behavior is cooperative.         Assessment & Plan:     ICD-10-CM   1. Pure hypercholesterolemia  Q91.69 COMPLETE METABOLIC PANEL WITH GFR    Lipid panel   Continues to only desire to work on diet and lifestyle does not want to take any pills reviewed his lipids and ASCVD risk score  2. Benign prostatic hyperplasia with lower urinary tract symptoms, symptom details unspecified  N40.1 finasteride (PROSCAR) 5 MG tablet    Ambulatory referral to Urology   Patient notes worsening urinary symptoms over the last couple weeks, check urine, PSA, basic labs he denies any testicular/scrotal or abdominal pain  3. Encounter for medication monitoring  Z51.81 finasteride (PROSCAR) 5 MG tablet    COMPLETE METABOLIC PANEL WITH GFR    Lipid panel    CBC with Differential/Platelet  4. Colon cancer screening  Z12.11 CBC with Differential/Platelet    Cologuard   Is a difficult historian he cannot tell me what his family history is regarding cancer in his mother and father, due for Cologuard  5. Screening for  malignant neoplasm of prostate  Z12.5 PSA  6. Lower urinary tract symptoms (LUTS)  R39.9 Lipid panel    CBC with Differential/Platelet    PSA    Urinalysis, Routine w reflex microscopic    Ambulatory referral to Urology   Worsening symptoms explained PSA utilization and limitations     No follow-ups on file.   Delsa Grana, PA-C 08/13/20 2:17 PM

## 2020-08-13 NOTE — Patient Instructions (Signed)
Lab Results  Component Value Date   CHOL 203 (H) 05/01/2019   HDL 57 05/01/2019   LDLCALC 129 (H) 05/01/2019   TRIG 76 05/01/2019   CHOLHDL 3.6 05/01/2019   The 10-year ASCVD risk score Mikey Bussing DC Jr., et al., 2013) is: 19%   Values used to calculate the score:     Age: 70 years     Sex: Male     Is Non-Hispanic African American: No     Diabetic: No     Tobacco smoker: No     Systolic Blood Pressure: 289 mmHg     Is BP treated: No     HDL Cholesterol: 57 mg/dL     Total Cholesterol: 203 mg/dL   Health Maintenance  Topic Date Due  . COVID-19 Vaccine (1) Never done  . Cologuard (Stool DNA test)  07/13/2019  . Tetanus Vaccine  05/03/2020  . Flu Shot  12/01/2020  .  Hepatitis C: One time screening is recommended by Center for Disease Control  (CDC) for  adults born from [birth year] through 1966.   Completed  . Pneumonia vaccines  Completed  . HPV Vaccine  Aged Out

## 2020-08-14 ENCOUNTER — Ambulatory Visit (INDEPENDENT_AMBULATORY_CARE_PROVIDER_SITE_OTHER): Payer: Medicare HMO | Admitting: Family Medicine

## 2020-08-14 ENCOUNTER — Telehealth: Payer: Self-pay

## 2020-08-14 DIAGNOSIS — R399 Unspecified symptoms and signs involving the genitourinary system: Secondary | ICD-10-CM

## 2020-08-14 DIAGNOSIS — N401 Enlarged prostate with lower urinary tract symptoms: Secondary | ICD-10-CM

## 2020-08-14 DIAGNOSIS — E7800 Pure hypercholesterolemia, unspecified: Secondary | ICD-10-CM

## 2020-08-14 NOTE — Progress Notes (Signed)
Pt to return this am for labs and had multiple additional questions and additional complaints which could not be addressed with his routine OV yesterday after not being seen in clinic for over a year.  He came for reg f/up and med refill, I addressed new complaints, and could not address additional questions and concerns at the end of visit yesterday.  Staff assisted the pt and made a f/up plan. I have referred him today to CCM nurse for additional time for questions and education and we had made additional appointments and arranged for any future appt to be in a 40 min spot, not a 20 min reg f/up spot.    ICD-10-CM   1. Pure hypercholesterolemia  E78.00 AMB Referral to [place]  2. Benign prostatic hyperplasia with lower urinary tract symptoms, symptom details unspecified  N40.1 AMB Referral to [place]  3. Lower urinary tract symptoms (LUTS)  R39.9 AMB Referral to [place], PA-C 08/14/20 8:56 AM

## 2020-08-14 NOTE — Chronic Care Management (AMB) (Signed)
  Chronic Care Management   Note  08/14/2020 Name: Jeremiah Beck MRN: [id number] DOB: 1951-05-03  Jeremiah Beck is a 70 y.o. year old male who is a primary care patient of Delsa Grana, [place]. I reached out to Lucia Gaskins by phone today in response to a referral sent by Mr. Jaci Standard PCP, Delsa Grana, PA-C     Mr. Schippers was given information about Chronic Care Management services today including:  1. CCM service includes personalized support from designated clinical staff supervised by his physician, including individualized plan of care and coordination with other care providers 2. 24/7 contact phone numbers for assistance for urgent and routine care needs. 3. Service will only be billed when office clinical staff spend 20 minutes or more in a month to coordinate care. 4. Only one practitioner may furnish and bill the service in a calendar month. 5. The patient may stop CCM services at any time (effective at the end of the month) by phone call to the office staff. 6. The patient will be responsible for cost sharing (co-pay) of up to 20% of the service fee (after annual deductible is met).  Patient agreed to services and verbal consent obtained.   Follow up plan: Telephone appointment with care management team member scheduled for:08/21/2020  Noreene Larsson, [place], [place], [place] [address] Direct Dial: [phone] [email or web address]: [email or web address]

## 2020-08-15 LAB — URINALYSIS, ROUTINE W REFLEX MICROSCOPIC
Bilirubin Urine: NEGATIVE
Glucose, UA: NEGATIVE
Hyaline Cast: NONE SEEN /LPF
Ketones, ur: NEGATIVE
Nitrite: NEGATIVE
Protein, ur: NEGATIVE
RBC / HPF: NONE SEEN /HPF (ref 0–2)
Specific Gravity, Urine: 1.004 (ref 1.001–1.03)
Squamous Epithelial / HPF: NONE SEEN /HPF (ref <=5)
WBC, UA: >=60 /HPF — AB (ref 0–5)
pH: 7.5 (ref 5.0–8.0)

## 2020-08-15 LAB — COMPLETE METABOLIC PANEL WITH GFR
AG Ratio: 1.5 (calc) (ref 1.0–2.5)
ALT: 12 U/L (ref 9–46)
AST: 16 U/L (ref 10–35)
Albumin: 4.3 g/dL (ref 3.6–5.1)
Alkaline phosphatase (APISO): 92 U/L (ref 35–144)
BUN: 10 mg/dL (ref 7–25)
CO2: 25 mmol/L (ref 20–32)
Calcium: 9.2 mg/dL (ref 8.6–10.3)
Chloride: 103 mmol/L (ref 98–110)
Creat: 0.99 mg/dL (ref 0.70–1.18)
GFR, Est African American: 89 mL/min/{1.73_m2} (ref >=60)
GFR, Est Non African American: 77 mL/min/{1.73_m2} (ref >=60)
Globulin: 2.8 g/dL (calc) (ref 1.9–3.7)
Glucose, Bld: 86 mg/dL (ref 65–99)
Potassium: 4.3 mmol/L (ref 3.5–5.3)
Sodium: 137 mmol/L (ref 135–146)
Total Bilirubin: 1 mg/dL (ref 0.2–1.2)
Total Protein: 7.1 g/dL (ref 6.1–8.1)

## 2020-08-15 LAB — CBC WITH DIFFERENTIAL/PLATELET
Absolute Monocytes: 1022 cells/uL — ABNORMAL HIGH (ref 200–950)
Basophils Absolute: 102 cells/uL (ref 0–200)
Basophils Relative: 1.4 %
Eosinophils Absolute: 402 cells/uL (ref 15–500)
Eosinophils Relative: 5.5 %
HCT: 45.6 % (ref 38.5–50.0)
Hemoglobin: 15.5 g/dL (ref 13.2–17.1)
Lymphs Abs: 1891 cells/uL (ref 850–3900)
MCH: 32 pg (ref 27.0–33.0)
MCHC: 34 g/dL (ref 32.0–36.0)
MCV: 94.2 fL (ref 80.0–100.0)
MPV: 12 fL (ref 7.5–12.5)
Monocytes Relative: 14 %
Neutro Abs: 3884 cells/uL (ref 1500–7800)
Neutrophils Relative %: 53.2 %
Platelets: 275 10*3/uL (ref 140–400)
RBC: 4.84 10*6/uL (ref 4.20–5.80)
RDW: 12.3 % (ref 11.0–15.0)
Total Lymphocyte: 25.9 %
WBC: 7.3 10*3/uL (ref 3.8–10.8)

## 2020-08-15 LAB — LIPID PANEL
Cholesterol: 194 mg/dL (ref <200)
HDL: 52 mg/dL (ref >=40)
LDL Cholesterol (Calc): 127 mg/dL (calc) — ABNORMAL HIGH
Non-HDL Cholesterol (Calc): 142 mg/dL (calc) — ABNORMAL HIGH (ref <130)
Total CHOL/HDL Ratio: 3.7 (calc) (ref <5.0)
Triglycerides: 62 mg/dL (ref <150)

## 2020-08-15 LAB — PSA: PSA: 0.3 ng/mL (ref <=4.0)

## 2020-08-18 ENCOUNTER — Ambulatory Visit: Payer: Medicare HMO | Admitting: Family Medicine

## 2020-08-19 ENCOUNTER — Other Ambulatory Visit: Payer: Self-pay | Admitting: Family Medicine

## 2020-08-19 MED ORDER — CEPHALEXIN 500 MG PO CAPS: 500.0000 mg | ORAL_CAPSULE | Freq: Two times a day (BID) | ORAL | 0 refills | Status: AC

## 2020-08-21 ENCOUNTER — Telehealth: Payer: Self-pay

## 2020-08-21 ENCOUNTER — Telehealth: Payer: Medicare HMO

## 2020-08-21 NOTE — Telephone Encounter (Signed)
  Chronic Care Management   Outreach Note  08/21/2020 Name: Jeremiah Beck MRN: [id number] DOB: [birth year]  Primary Care Provider: Delsa Grana, PA-C Reason for referral : Chronic Care Management   An unsuccessful telephone outreach was attempted today. [name] was referred to the case management team for assistance with care management and care coordination.     Follow Up Plan:  Received automated message. Unable to leave a voice message. Will inform the Care Guide team to assist with rescheduling.    Cristy [facility] Care Management [facility] [phone]

## 2020-09-03 ENCOUNTER — Telehealth: Payer: Self-pay

## 2020-09-03 ENCOUNTER — Telehealth: Payer: Medicare HMO

## 2020-09-03 NOTE — Telephone Encounter (Signed)
  Chronic Care Management   Outreach Note  09/03/2020 Name: [name] MRN: [id number] DOB: 11/16/1950  Primary Care Provider: Delsa Grana, PA-C Reason for referral : Chronic Care Management   A second unsuccessful telephone outreach was attempted today. [name] was referred to the case management team for assistance with care management and care coordination.      Follow Up Plan:  Multiple call attempts were made today. Kept receiving an automated message. Unable to leave a voice message. Will inform the Care Guide team to assist with rescheduling.    Cristy [facility] Care Management [facility] [phone]

## 2020-09-09 ENCOUNTER — Ambulatory Visit (INDEPENDENT_AMBULATORY_CARE_PROVIDER_SITE_OTHER): Payer: Medicare HMO

## 2020-09-09 DIAGNOSIS — Z0000 Encounter for general adult medical examination without abnormal findings: Secondary | ICD-10-CM | POA: Diagnosis not present

## 2020-09-09 NOTE — Progress Notes (Signed)
Subjective:   Jeremiah Beck is a 70 y.o. male who presents for Medicare Annual/Subsequent preventive examination.  Review of Systems     Cardiac Risk Factors include: advanced age (>6mn, >>20women)     Objective:    Today's Vitals   09/09/20 1341  PainSc: 7    There is no height or weight on file to calculate BMI.  Advanced Directives 09/09/2020 02/22/2017 06/23/2016  Does Patient Have a Medical Advance Directive? No No No  Would patient like information on creating a medical advance directive? No - Patient declined - -    Current Medications (verified) Outpatient Encounter Medications as of 09/09/2020  Medication Sig  . finasteride (PROSCAR) 5 MG tablet Take 1 tablet (5 mg total) by mouth daily.  . Multiple Vitamins-Minerals (ONE-A-DAY MENS 50+ ADVANTAGE) TABS Take by mouth.  . Omega-3 Fatty Acids (FISH OIL) 1000 MG CAPS Take by mouth.  . Turmeric 500 MG CAPS Take by mouth.  .Marland Kitchenaspirin EC 81 MG tablet Take 81 mg by mouth daily. (Patient not taking: Reported on 09/09/2020)  . [DISCONTINUED] ketoconazole (NIZORAL) 2 % cream Apply topically. (Patient not taking: No sig reported)  . [DISCONTINUED] rosuvastatin (CRESTOR) 10 MG tablet Take 1 tablet (10 mg total) by mouth at bedtime. (Patient not taking: No sig reported)  . [DISCONTINUED] terbinafine (LAMISIL) 250 MG tablet Take 250 mg by mouth daily. (Patient not taking: No sig reported)   No facility-administered encounter medications on file as of 09/09/2020.    Allergies (verified) Patient has no known allergies.   History: Past Medical History:  Diagnosis Date  . BPH (benign prostatic hypertrophy)   . Degenerative arthritis of cervical spine 02/23/2017   xrays Oct 2018  . ED (erectile dysfunction)    Past Surgical History:  Procedure Laterality Date  . HERNIA REPAIR    . SKIN CANCER EXCISION     removed from face   Family History  Problem Relation Age of Onset  . Cancer Father   . Alcohol abuse Father   .  Cancer Mother   . Varicose Veins Paternal Grandmother    Social History   Socioeconomic History  . Marital status: Divorced    Spouse name: Not on file  . Number of children: 0  . Years of education: Not on file  . Highest education level: Some college, no degree  Occupational History  . Occupation: retired  Tobacco Use  . Smoking status: Never Smoker  . Smokeless tobacco: Never Used  Vaping Use  . Vaping Use: Never used  Substance and Sexual Activity  . Alcohol use: No  . Drug use: No  . Sexual activity: Not Currently  Other Topics Concern  . Not on file  Social History Narrative  . Not on file   Social Determinants of Health   Financial Resource Strain: Low Risk   . Difficulty of Paying Living Expenses: Not very hard  Food Insecurity: No Food Insecurity  . Worried About RCharity fundraiserin the Last Year: Never true  . Ran Out of Food in the Last Year: Never true  Transportation Needs: No Transportation Needs  . Lack of Transportation (Medical): No  . Lack of Transportation (Non-Medical): No  Physical Activity: Sufficiently Active  . Days of Exercise per Week: 5 days  . Minutes of Exercise per Session: 30 min  Stress: No Stress Concern Present  . Feeling of Stress : Not at all  Social Connections: Unknown  . Frequency of Communication with Friends  and Family: Twice a week  . Frequency of Social Gatherings with Friends and Family: Three times a week  . Attends Religious Services: Never  . Active Member of Clubs or Organizations: No  . Attends Archivist Meetings: Never  . Marital Status: Not on file    Tobacco Counseling Counseling given: Not Answered   Clinical Intake:  Pre-visit preparation completed: Yes  Pain : 0-10 Pain Score: 7  Pain Type: Other (Comment) (pain from a corn on foot) Pain Location: Foot Pain Orientation: Left Pain Radiating Towards: tingling towards bottom foot.  also has corn on foot Pain Descriptors / Indicators:  Tingling,Aching Pain Onset: More than a month ago Pain Frequency: Intermittent Pain Relieving Factors: tylenol Effect of Pain on Daily Activities: yes  Pain Relieving Factors: tylenol  Nutritional Risks: None Diabetes: No  How often do you need to have someone help you when you read instructions, pamphlets, or other written materials from your doctor or pharmacy?: 1 - Never    Interpreter Needed?: No  Information entered by :: Leroy Kennedy LPN   Activities of Daily Living In your present state of health, do you have any difficulty performing the following activities: 09/09/2020 08/13/2020  Hearing? N Y  Vision? N N  Difficulty concentrating or making decisions? N N  Walking or climbing stairs? N N  Dressing or bathing? N N  Doing errands, shopping? N N  Preparing Food and eating ? N -  Using the Toilet? N -  In the past six months, have you accidently leaked urine? N -  Do you have problems with loss of bowel control? N -  Managing your Medications? N -  Managing your Finances? N -  Housekeeping or managing your Housekeeping? N -  Some recent data might be hidden    Patient Care Team: Delsa Grana, PA-C as PCP - General (Family Medicine) Lada, Satira Anis, MD as PCP - Family Medicine (Family Medicine) Jannet Mantis, MD (Dermatology) Neldon Labella, RN as Registered Nurse  Indicate any recent Medical Services you may have received from other than [facility] providers in the past year (date may be approximate).     Assessment:   This is a routine wellness examination for Jeremiah Beck.  Hearing/Vision screen  Hearing Screening   _0  _1  _2  _3  _4  _5  _6  _7  _8   Right ear:           Left ear:           Comments: Pt denies hearing difficulty  Vision Screening Comments: Established with [place]: due for exam  Dietary issues and exercise activities discussed: Current Exercise Habits: Home exercise routine, Time (Minutes): 35,  Frequency (Times/Week): 4, Weekly Exercise (Minutes/Week): 140, Intensity: Mild  Goals Addressed   None    Depression Screen PHQ 2/9 Scores 09/09/2020 09/09/2020 08/13/2020 05/01/2019 01/16/2019 10/16/2018 07/19/2018  PHQ - 2 Score 0 0 0 0 0 0 0  PHQ- 9 Score - - - 0 0 0 -    Fall Risk Fall Risk  09/09/2020 08/13/2020 05/01/2019 01/16/2019 10/16/2018  Falls in the past year? 0 0 0 0 0  Number falls in past yr: 0 - 0 0 0  Injury with Fall? 0 - 0 0 -  Risk for fall due to : No Fall Risks - - - -  Follow up Falls prevention discussed Falls prevention discussed - - -    FALL RISK PREVENTION PERTAINING TO THE HOME:  Any stairs in or around the home?  Yes  If so, are there any without handrails? Yes  Home free of loose throw rugs in walkways, pet beds, electrical cords, etc? Yes  Adequate lighting in your home to reduce risk of falls? Yes   ASSISTIVE DEVICES UTILIZED TO PREVENT FALLS:  Life alert? No  Use of a cane, walker or w/c? No  Grab bars in the bathroom? No  Shower chair or bench in shower? No  Elevated toilet seat or a handicapped toilet? No   TIMED UP AND GO:  Was the test performed? No .      Cognitive Function:  Normal cognitive status assessed by direct observation by this Nurse Health Advisor. No abnormalities found.         Immunizations Immunization History  Administered Date(s) Administered  . Influenza, High Dose Seasonal PF 02/22/2017  . Influenza,inj,Quad PF,6+ Mos 02/01/2019  . Pneumococcal Conjugate-13 02/22/2017  . Pneumococcal Polysaccharide-23 05/01/2019  . Td 01/01/2002, 05/03/2010        Pneumococcal vaccine status: Declined,  Education has been provided regarding the importance of this vaccine but patient still declined. Advised may receive this vaccine at local pharmacy or Health Dept. Aware to provide a copy of the vaccination record if obtained from local pharmacy or Health Dept. Verbalized acceptance and understanding.   Covid-19 vaccine  status: Declined, Education has been provided regarding the importance of this vaccine but patient still declined. Advised may receive this vaccine at local pharmacy or Health Dept.or vaccine clinic. Aware to provide a copy of the vaccination record if obtained from local pharmacy or Health Dept. Verbalized acceptance and understanding.  Qualifies for Shingles Vaccine? No   Zostavax completed No   Shingrix Completed?: No.    Education has been provided regarding the importance of this vaccine. Patient has been advised to call insurance company to determine out of pocket expense if they have not yet received this vaccine. Advised may also receive vaccine at local pharmacy or Health Dept. Verbalized acceptance and understanding.  Screening Tests Health Maintenance  Topic Date Due  . COVID-19 Vaccine (1) Never done  . Fecal DNA (Cologuard)  07/13/2019  . TETANUS/TDAP  05/03/2020  . INFLUENZA VACCINE  12/01/2020  . Hepatitis C Screening  Completed  . PNA vac Low Risk Adult  Completed  . HPV VACCINES  Aged Out    Health Maintenance  Health Maintenance Due  Topic Date Due  . COVID-19 Vaccine (1) Never done  . Fecal DNA (Cologuard)  07/13/2019  . TETANUS/TDAP  05/03/2020    Colorectal cancer screening: Cologuard completed 07/12/16. Repeat ever 3 years. Pt states Cologuard kit is at his post office awaiting pick up. Advised to complete and return.   Lung Cancer Screening: (Low Dose CT Chest recommended if Age 57-80 years, 30 pack-year currently smoking OR have quit w/in 15years.) does not qualify.   Additional Screening:  Hepatitis C Screening: does qualify; Completed 08/29/17.  Vision Screening: Recommended annual ophthalmology exams for early detection of glaucoma and other disorders of the eye. Is the patient up to date with their annual eye exam?  No  Who is the provider or what is the name of the office in which the patient attends annual eye exams? [facility].   Dental  Screening: Recommended annual dental exams for proper oral hygiene  Community Resource Referral / Chronic Care Management: CRR required this visit?  No   CCM required this visit?  No      Plan:     I have  personally reviewed and noted the following in the patient's chart:   . Medical and social history . Use of alcohol, tobacco or illicit drugs  . Current medications and supplements including opioid prescriptions. Patient is not currently taking opioid prescriptions. . Functional ability and status . Nutritional status . Physical activity . Advanced directives . List of other physicians . Hospitalizations, surgeries, and ER visits in previous 12 months . Vitals . Screenings to include cognitive, depression, and falls . Referrals and appointments  In addition, I have reviewed and discussed with patient certain preventive protocols, quality metrics, and best practice recommendations. A written personalized care plan for preventive services as well as general preventive health recommendations were provided to patient.     Clemetine Marker, LPN   1/79/1995   Nurse Notes: pt advised to contact office for appt reagarding pain in feet from corns. Pt may also need podiatry referral and needs to discuss with PCP.

## 2020-09-09 NOTE — Patient Instructions (Signed)
Jeremiah Beck , Thank you for taking time to come for your Medicare Wellness Visit. I appreciate your ongoing commitment to your health goals. Please review the following plan we discussed and let me know if I can assist you in the future.   Screening recommendations/referrals: Colonoscopy: Cologuard done 07/12/16. Please complete kit and return.  Recommended yearly ophthalmology/optometry visit for glaucoma screening and checkup Recommended yearly dental visit for hygiene and checkup  Vaccinations: Influenza vaccine: postponed Pneumococcal vaccine: done 03/01/19 Tdap vaccine: due Shingles vaccine: Shingrix discussed. Please contact your pharmacy for coverage information.  Covid-19: declined  Advanced directives: Advance directive discussed with you today. Even though you declined this today please call our office should you change your mind and we can give you the proper paperwork for you to fill out.  Conditions/risks identified: Keep up the great work!  Next appointment: Follow up in one year for your annual wellness visit.   Preventive Care 31 Years and Older, Male Preventive care refers to lifestyle choices and visits with your health care provider that can promote health and wellness. What does preventive care include?  A yearly physical exam. This is also called an annual well check.  Dental exams once or twice a year.  Routine eye exams. Ask your health care provider how often you should have your eyes checked.  Personal lifestyle choices, including:  Daily care of your teeth and gums.  Regular physical activity.  Eating a healthy diet.  Avoiding tobacco and drug use.  Limiting alcohol use.  Practicing safe sex.  Taking low doses of aspirin every day.  Taking vitamin and mineral supplements as recommended by your health care provider. What happens during an annual well check? The services and screenings done by your health care provider during your annual well  check will depend on your age, overall health, lifestyle risk factors, and family history of disease. Counseling  Your health care provider may ask you questions about your:  Alcohol use.  Tobacco use.  Drug use.  Emotional well-being.  Home and relationship well-being.  Sexual activity.  Eating habits.  History of falls.  Memory and ability to understand (cognition).  Work and work Statistician. Screening  You may have the following tests or measurements:  Height, weight, and BMI.  Blood pressure.  Lipid and cholesterol levels. These may be checked every 5 years, or more frequently if you are over 62 years old.  Skin check.  Lung cancer screening. You may have this screening every year starting at age 9 if you have a 30-pack-year history of smoking and currently smoke or have quit within the past 15 years.  Fecal occult blood test (FOBT) of the stool. You may have this test every year starting at age 69.  Flexible sigmoidoscopy or colonoscopy. You may have a sigmoidoscopy every 5 years or a colonoscopy every 10 years starting at age 42.  Prostate cancer screening. Recommendations will vary depending on your family history and other risks.  Hepatitis C blood test.  Hepatitis B blood test.  Sexually transmitted disease (STD) testing.  Diabetes screening. This is done by checking your blood sugar (glucose) after you have not eaten for a while (fasting). You may have this done every 1-3 years.  Abdominal aortic aneurysm (AAA) screening. You may need this if you are a current or former smoker.  Osteoporosis. You may be screened starting at age 48 if you are at high risk. Talk with your health care provider about your test results, treatment  options, and if necessary, the need for more tests. Vaccines  Your health care provider may recommend certain vaccines, such as:  Influenza vaccine. This is recommended every year.  Tetanus, diphtheria, and acellular  pertussis (Tdap, Td) vaccine. You may need a Td booster every 10 years.  Zoster vaccine. You may need this after age 78.  Pneumococcal 13-valent conjugate (PCV13) vaccine. One dose is recommended after age 42.  Pneumococcal polysaccharide (PPSV23) vaccine. One dose is recommended after age 62. Talk to your health care provider about which screenings and vaccines you need and how often you need them. This information is not intended to replace advice given to you by your health care provider. Make sure you discuss any questions you have with your health care provider. Document Released: 05/16/2015 Document Revised: 01/07/2016 Document Reviewed: 02/18/2015 Elsevier Interactive Patient Education  2017 [place] Prevention in the Home Falls can cause injuries. They can happen to people of all ages. There are many things you can do to make your home safe and to help prevent falls. What can I do on the outside of my home?  Regularly fix the edges of walkways and driveways and fix any cracks.  Remove anything that might make you trip as you walk through a door, such as a raised step or threshold.  Trim any bushes or trees on the path to your home.  Use bright outdoor lighting.  Clear any walking paths of anything that might make someone trip, such as rocks or tools.  Regularly check to see if handrails are loose or broken. Make sure that both sides of any steps have handrails.  Any raised decks and porches should have guardrails on the edges.  Have any leaves, snow, or ice cleared regularly.  Use sand or salt on walking paths during winter.  Clean up any spills in your garage right away. This includes oil or grease spills. What can I do in the bathroom?  Use night lights.  Install grab bars by the toilet and in the tub and shower. Do not use towel bars as grab bars.  Use non-skid mats or decals in the tub or shower.  If you need to sit down in the shower, use a plastic,  non-slip stool.  Keep the floor dry. Clean up any water that spills on the floor as soon as it happens.  Remove soap buildup in the tub or shower regularly.  Attach bath mats securely with double-sided non-slip rug tape.  Do not have throw rugs and other things on the floor that can make you trip. What can I do in the bedroom?  Use night lights.  Make sure that you have a light by your bed that is easy to reach.  Do not use any sheets or blankets that are too big for your bed. They should not hang down onto the floor.  Have a firm chair that has side arms. You can use this for support while you get dressed.  Do not have throw rugs and other things on the floor that can make you trip. What can I do in the kitchen?  Clean up any spills right away.  Avoid walking on wet floors.  Keep items that you use a lot in easy-to-reach places.  If you need to reach something above you, use a strong step stool that has a grab bar.  Keep electrical cords out of the way.  Do not use floor polish or wax that makes floors slippery.  If you must use wax, use non-skid floor wax.  Do not have throw rugs and other things on the floor that can make you trip. What can I do with my stairs?  Do not leave any items on the stairs.  Make sure that there are handrails on both sides of the stairs and use them. Fix handrails that are broken or loose. Make sure that handrails are as long as the stairways.  Check any carpeting to make sure that it is firmly attached to the stairs. Fix any carpet that is loose or worn.  Avoid having throw rugs at the top or bottom of the stairs. If you do have throw rugs, attach them to the floor with carpet tape.  Make sure that you have a light switch at the top of the stairs and the bottom of the stairs. If you do not have them, ask someone to add them for you. What else can I do to help prevent falls?  Wear shoes that:  Do not have high heels.  Have rubber  bottoms.  Are comfortable and fit you well.  Are closed at the toe. Do not wear sandals.  If you use a stepladder:  Make sure that it is fully opened. Do not climb a closed stepladder.  Make sure that both sides of the stepladder are locked into place.  Ask someone to hold it for you, if possible.  Clearly mark and make sure that you can see:  Any grab bars or handrails.  First and last steps.  Where the edge of each step is.  Use tools that help you move around (mobility aids) if they are needed. These include:  Canes.  Walkers.  Scooters.  Crutches.  Turn on the lights when you go into a dark area. Replace any light bulbs as soon as they burn out.  Set up your furniture so you have a clear path. Avoid moving your furniture around.  If any of your floors are uneven, fix them.  If there are any pets around you, be aware of where they are.  Review your medicines with your doctor. Some medicines can make you feel dizzy. This can increase your chance of falling. Ask your doctor what other things that you can do to help prevent falls. This information is not intended to replace advice given to you by your health care provider. Make sure you discuss any questions you have with your health care provider. Document Released: 02/13/2009 Document Revised: 09/25/2015 Document Reviewed: 05/24/2014 Elsevier Interactive Patient Education  2017 [employer].

## 2020-10-23 ENCOUNTER — Ambulatory Visit: Payer: Self-pay

## 2020-10-23 DIAGNOSIS — E7800 Pure hypercholesterolemia, unspecified: Secondary | ICD-10-CM

## 2020-10-23 NOTE — Chronic Care Management (AMB) (Signed)
  Chronic Care Management   Note   10/23/2020 Name: Jeremiah Beck MRN: [id number] DOB: Sep 26, 1950   Primary Care Provider: Delsa Grana, PA-C Reason for referral : Chronic Care Management   Third unsuccessful telephone outreach was attempted today. Mr. Repinski was referred to the care management team for assistance with chronic care management and care coordination. His primary care provider will be notified of our unsuccessful attempts to establish contact. The care management team will gladly outreach at any time in the future if he is interested in receiving assistance.    Follow Up Plan:  The care management team will gladly follow up with Mr. Pieczynski after the primary care provider has a conversation with him regarding recommendation for care management engagement and subsequent re-referral for care management services.    [facility] [phone]

## 2021-02-10 ENCOUNTER — Telehealth: Payer: Self-pay

## 2021-02-10 NOTE — Telephone Encounter (Signed)
Pt notified, no  fasting

## 2021-02-10 NOTE — Telephone Encounter (Signed)
Copied from [place] [id number]. Topic: General - Other >> Feb 10, 2021 11:41 AM Pawlus, Brayton Layman A wrote: Reason for CRM: Pt wanted to know if he will have to do bloodwork for his appt on 11/9 and if he needs to fast or can come before his appt to get this done, please advise.

## 2021-02-12 ENCOUNTER — Ambulatory Visit: Payer: Medicare HMO | Admitting: Nurse Practitioner

## 2021-03-11 ENCOUNTER — Ambulatory Visit: Payer: Medicare HMO | Admitting: Nurse Practitioner

## 2021-04-28 ENCOUNTER — Encounter: Payer: Self-pay | Admitting: Internal Medicine

## 2021-04-28 ENCOUNTER — Ambulatory Visit (INDEPENDENT_AMBULATORY_CARE_PROVIDER_SITE_OTHER): Payer: Medicare HMO | Admitting: Internal Medicine

## 2021-04-28 ENCOUNTER — Other Ambulatory Visit: Payer: Self-pay

## 2021-04-28 VITALS — BP 116/64 | HR 63 | Temp 97.5°F | Resp 16 | Ht 71.0 in | Wt 205.1 lb

## 2021-04-28 DIAGNOSIS — Z23 Encounter for immunization: Secondary | ICD-10-CM | POA: Diagnosis not present

## 2021-04-28 DIAGNOSIS — E7800 Pure hypercholesterolemia, unspecified: Secondary | ICD-10-CM | POA: Diagnosis not present

## 2021-04-28 DIAGNOSIS — Z0000 Encounter for general adult medical examination without abnormal findings: Secondary | ICD-10-CM

## 2021-04-28 DIAGNOSIS — M1A9XX0 Chronic gout, unspecified, without tophus (tophi): Secondary | ICD-10-CM | POA: Diagnosis not present

## 2021-04-28 NOTE — Patient Instructions (Addendum)
It was great seeing you today!  Plan discussed at today's visit: -Tdap today -Due to Flu and Shingles vaccine at pharmacy -Check expiration date on Cologuard and let me know -For gout, can take anti-inflammatories as needed for gout, can take Allopurinol daily as well   Follow up in: 6 months   Take care and let us know if you have any questions or concerns prior to your next visit.  Dr. Rosana Berger  Gout Gout is a condition that causes painful swelling of the joints. Gout is a type of inflammation of the joints (arthritis). This condition is caused by having too much uric acid in the body. Uric acid is a chemical that forms when the body breaks down substances called purines. Purines are important for building body proteins. When the body has too much uric acid, sharp crystals can form and build up inside the joints. This causes pain and swelling. Gout attacks can happen quickly and may be very painful (acute gout). Over time, the attacks can affect more joints and become more frequent (chronic gout). Gout can also cause uric acid to build up under the skin and inside the kidneys. What are the causes? This condition is caused by too much uric acid in your blood. This can happen because: Your kidneys do not remove enough uric acid from your blood. This is the most common cause. Your body makes too much uric acid. This can happen with some cancers and cancer treatments. It can also occur if your body is breaking down too many red blood cells (hemolytic anemia). You eat too many foods that are high in purines. These foods include organ meats and some seafood. Alcohol, especially beer, is also high in purines. A gout attack may be triggered by trauma or stress. What increases the risk? You are more likely to develop this condition if you: Have a family history of gout. Are male and middle-aged. Are male and have gone through menopause. Are obese. Frequently drink alcohol, especially  beer. Are dehydrated. Lose weight too quickly. Have an organ transplant. Have lead poisoning. Take certain medicines, including aspirin, cyclosporine, diuretics, levodopa, and niacin. Have kidney disease. Have a skin condition called psoriasis. What are the signs or symptoms? An attack of acute gout happens quickly. It usually occurs in just one joint. The most common place is the big toe. Attacks often start at night. Other joints that may be affected include joints of the feet, ankle, knee, fingers, wrist, or elbow. Symptoms of this condition may include: Severe pain. Warmth. Swelling. Stiffness. Tenderness. The affected joint may be very painful to touch. Shiny, red, or purple skin. Chills and fever. Chronic gout may cause symptoms more frequently. More joints may be involved. You may also have white or yellow lumps (tophi) on your hands or feet or in other areas near your joints. How is this diagnosed? This condition is diagnosed based on your symptoms, medical history, and physical exam. You may have tests, such as: Blood tests to measure uric acid levels. Removal of joint fluid with a thin needle (aspiration) to look for uric acid crystals. X-rays to look for joint damage. How is this treated? Treatment for this condition has two phases: treating an acute attack and preventing future attacks. Acute gout treatment may include medicines to reduce pain and swelling, including: NSAIDs. Steroids. These are strong anti-inflammatory medicines that can be taken by mouth (orally) or injected into a joint. Colchicine. This medicine relieves pain and swelling when it is taken  soon after an attack. It can be given by mouth or through an IV. Preventive treatment may include: Daily use of smaller doses of NSAIDs or colchicine. Use of a medicine that reduces uric acid levels in your blood. Changes to your diet. You may need to see a dietitian about what to eat and drink to prevent gout. Follow  these instructions at home: During a gout attack If directed, put ice on the affected area: Put ice in a plastic bag. Place a towel between your skin and the bag. Leave the ice on for 20 minutes, 2-3 times a day. Raise (elevate) the affected joint above the level of your heart as often as possible. Rest the joint as much as possible. If the affected joint is in your leg, you may be given crutches to use. Follow instructions from your health care provider about eating or drinking restrictions. Avoiding future gout attacks Follow a low-purine diet as told by your dietitian or health care provider. Avoid foods and drinks that are high in purines, including liver, kidney, anchovies, asparagus, herring, mushrooms, mussels, and beer. Maintain a healthy weight or lose weight if you are overweight. If you want to lose weight, talk with your health care provider. It is important that you do not lose weight too quickly. Start or maintain an exercise program as told by your health care provider. Eating and drinking Drink enough fluids to keep your urine pale yellow. If you drink alcohol: Limit how much you use to: 0-1 drink a day for women. 0-2 drinks a day for men. Be aware of how much alcohol is in your drink. In the [place] one drink equals one 12 oz bottle of beer (355 mL) one 5 oz glass of wine (148 mL), or one 1 oz glass of hard liquor (44 mL). General instructions Take over-the-counter and prescription medicines only as told by your health care provider. Do not drive or use heavy machinery while taking prescription pain medicine. Return to your normal activities as told by your health care provider. Ask your health care provider what activities are safe for you. Keep all follow-up visits as told by your health care provider. This is important. Contact a health care provider if you have: Another gout attack. Continuing symptoms of a gout attack after 10 days of treatment. Side effects from your  medicines. Chills or a fever. Burning pain when you urinate. Pain in your lower back or belly. Get help right away if you: Have severe or uncontrolled pain. Cannot urinate. Summary Gout is painful swelling of the joints caused by inflammation. The most common site of pain is the big toe, but it can affect other joints in the body. Medicines and dietary changes can help to prevent and treat gout attacks. This information is not intended to replace advice given to you by your health care provider. Make sure you discuss any questions you have with your health care provider. Document Revised: 10/28/2017 Document Reviewed: 11/09/2017 Elsevier Patient Education  2022 [place] refers to food and lifestyle choices that are based on the traditions of countries located on the [employer]. It focuses on eating more fruits, vegetables, whole grains, beans, nuts, seeds, and heart-healthy fats, and eating less dairy, meat, eggs, and processed foods with added sugar, salt, and fat. This way of eating has been shown to help prevent certain conditions and improve outcomes for people who have chronic diseases, like kidney disease and heart disease. What are  tips for following this plan? Reading food labels Check the serving size of packaged foods. For foods such as rice and pasta, the serving size refers to the amount of cooked product, not dry. Check the total fat in packaged foods. Avoid foods that have saturated fat or trans fats. Check the ingredient list for added sugars, such as corn syrup. Shopping  Buy a variety of foods that offer a balanced diet, including: Fresh fruits and vegetables (produce). Grains, beans, nuts, and seeds. Some of these may be available in unpackaged forms or large amounts (in bulk). Fresh seafood. Poultry and eggs. Low-fat dairy products. Buy whole ingredients instead of prepackaged foods. Buy fresh fruits and  vegetables in-season from local farmers markets. Buy plain frozen fruits and vegetables. If you do not have access to quality fresh seafood, buy precooked frozen shrimp or canned fish, such as tuna, salmon, or sardines. Stock your pantry so you always have certain foods on hand, such as olive oil, canned tuna, canned tomatoes, rice, pasta, and beans. Cooking Cook foods with extra-virgin olive oil instead of using butter or other vegetable oils. Have meat as a side dish, and have vegetables or grains as your main dish. This means having meat in small portions or adding small amounts of meat to foods like pasta or stew. Use beans or vegetables instead of meat in common dishes like chili or lasagna. Experiment with different cooking methods. Try roasting, broiling, steaming, and sauting vegetables. Add frozen vegetables to soups, stews, pasta, or rice. Add nuts or seeds for added healthy fats and plant protein at each meal. You can add these to yogurt, salads, or vegetable dishes. Marinate fish or vegetables using olive oil, lemon juice, garlic, and fresh herbs. Meal planning Plan to eat one vegetarian meal one day each week. Try to work up to two vegetarian meals, if possible. Eat seafood two or more times a week. Have healthy snacks readily available, such as: Vegetable sticks with hummus. Greek yogurt. Fruit and nut trail mix. Eat balanced meals throughout the week. This includes: Fruit: 2-3 servings a day. Vegetables: 4-5 servings a day. Low-fat dairy: 2 servings a day. Fish, poultry, or lean meat: 1 serving a day. Beans and legumes: 2 or more servings a week. Nuts and seeds: 1-2 servings a day. Whole grains: 6-8 servings a day. Extra-virgin olive oil: 3-4 servings a day. Limit red meat and sweets to only a few servings a month. Lifestyle  Cook and eat meals together with your family, when possible. Drink enough fluid to keep your urine pale yellow. Be physically active every day.  This includes: Aerobic exercise like running or swimming. Leisure activities like gardening, walking, or housework. Get 7-8 hours of sleep each night. If recommended by your health care provider, drink red wine in moderation. This means 1 glass a day for nonpregnant women and 2 glasses a day for men. A glass of wine equals 5 oz (150 mL). What foods should I eat? Fruits Apples. Apricots. Avocado. Berries. Bananas. Cherries. Dates. Figs. Grapes. Lemons. Melon. Oranges. Peaches. Plums. Pomegranate. Vegetables Artichokes. Beets. Broccoli. Cabbage. Carrots. Eggplant. Green beans. Chard. Kale. Spinach. Onions. Leeks. Peas. Squash. Tomatoes. Peppers. Radishes. Grains Whole-grain pasta. Brown rice. Bulgur wheat. Polenta. Couscous. Whole-wheat bread. [place]. Meats and other proteins Beans. Almonds. Sunflower seeds. Pine nuts. Peanuts. [place]. Salmon. Scallops. Shrimp. [place]. Tilapia. Clams. Oysters. Eggs. Poultry without skin. Dairy Low-fat milk. Cheese. Greek yogurt. Fats and oils Extra-virgin olive oil. Avocado oil. Grapeseed oil. Beverages Water. Red  wine. Herbal tea. Sweets and desserts Greek yogurt with honey. Baked apples. Poached pears. Trail mix. Seasonings and condiments Basil. Cilantro. Coriander. Cumin. Mint. Parsley. Sage. Rosemary. Tarragon. Garlic. Oregano. Thyme. Pepper. Balsamic vinegar. Tahini. Hummus. Tomato sauce. Olives. Mushrooms. The items listed above may not be a complete list of foods and beverages you can eat. Contact a dietitian for more information. What foods should I limit? This is a list of foods that should be eaten rarely or only on special occasions. Fruits Fruit canned in syrup. Vegetables Deep-fried potatoes (french fries). Grains Prepackaged pasta or rice dishes. Prepackaged cereal with added sugar. Prepackaged snacks with added sugar. Meats and other proteins Beef. Pork. Lamb. Poultry with skin. Hot dogs. Berniece Salines. Dairy Ice cream. Sour cream. Whole  milk. Fats and oils Butter. Canola oil. Vegetable oil. Beef fat (tallow). Lard. Beverages Juice. Sugar-sweetened soft drinks. Beer. Liquor and spirits. Sweets and desserts Cookies. Cakes. Pies. Candy. Seasonings and condiments Mayonnaise. Pre-made sauces and marinades. The items listed above may not be a complete list of foods and beverages you should limit. Contact a dietitian for more information. Summary The Mediterranean diet includes both food and lifestyle choices. Eat a variety of fresh fruits and vegetables, beans, nuts, seeds, and whole grains. Limit the amount of red meat and sweets that you eat. If recommended by your health care provider, drink red wine in moderation. This means 1 glass a day for nonpregnant women and 2 glasses a day for men. A glass of wine equals 5 oz (150 mL). This information is not intended to replace advice given to you by your health care provider. Make sure you discuss any questions you have with your health care provider. Document Revised: 05/25/2019 Document Reviewed: 03/22/2019 Elsevier Patient Education  2022 [employer].

## 2021-04-28 NOTE — Progress Notes (Signed)
Name: Jeremiah Beck   MRN: [id number]    DOB: 04/17/1951   Date:04/28/2021       Progress Note  Subjective  Chief Complaint  Chief Complaint  Patient presents with   Annual Exam    HPI  Patient presents for annual CPE.  Diet: decreased sugar in diet, no longer drinking sugary beverages. Eats a lot of vegetables, snacks on fruit instead of carbs Exercise: no routine but active with yard work   Gout:  -Last flare 2 weeks ago in left foot, with swelling, erythema and pain -Usually takes Tylenol when this happens, doesn't take Allopurinol or any preventative -Also having some nerve type pain in foot rarely but very painful when this happens -Gout flares about 2-3 times a year  Depression: phq 9 is negative Depression screen [facility] 2/9 04/28/2021 09/09/2020 09/09/2020 08/13/2020 05/01/2019  Decreased Interest 0 0 0 0 0  Down, Depressed, Hopeless 0 0 0 0 0  PHQ - 2 Score 0 0 0 0 0  Altered sleeping 0 - - - 0  Tired, decreased energy 0 - - - 0  Change in appetite 0 - - - 0  Feeling bad or failure about yourself  0 - - - 0  Trouble concentrating 0 - - - 0  Moving slowly or fidgety/restless 0 - - - 0  Suicidal thoughts 0 - - - 0  PHQ-9 Score 0 - - - 0  Difficult doing work/chores Not difficult at all - - - Not difficult at all    Hypertension:  BP Readings from Last 3 Encounters:  04/28/21 116/64  08/13/20 132/76  10/26/19 136/72    Obesity: Wt Readings from Last 3 Encounters:  04/28/21 205 lb 1.6 oz (93 kg)  08/13/20 208 lb 4.8 oz (94.5 kg)  10/26/19 204 lb (92.5 kg)   BMI Readings from Last 3 Encounters:  04/28/21 28.61 kg/m  08/13/20 29.05 kg/m  10/26/19 28.45 kg/m     Lipids:  Lab Results  Component Value Date   CHOL 194 08/14/2020   CHOL 203 (H) 05/01/2019   CHOL 186 01/29/2019   Lab Results  Component Value Date   HDL 52 08/14/2020   HDL 57 05/01/2019   HDL 53 01/29/2019   Lab Results  Component Value Date   LDLCALC 127 (H) 08/14/2020   LDLCALC  129 (H) 05/01/2019   LDLCALC 117 (H) 01/29/2019   Lab Results  Component Value Date   TRIG 62 08/14/2020   TRIG 76 05/01/2019   TRIG 87 01/29/2019   Lab Results  Component Value Date   CHOLHDL 3.7 08/14/2020   CHOLHDL 3.6 05/01/2019   CHOLHDL 3.5 01/29/2019   No results found for: LDLDIRECT Glucose:  Glucose  Date Value Ref Range Status  01/29/2019 89 65 - 99 mg/dL Final  08/29/2017 92 65 - 99 mg/dL Final   Glucose, Bld  Date Value Ref Range Status  08/14/2020 86 65 - 99 mg/dL Final    Comment:    .            Fasting reference interval .   05/01/2019 81 65 - 99 mg/dL Final    Comment:    .            Fasting reference interval .     Flowsheet Row Clinical Support from 09/09/2020 in [facility]  AUDIT-C Score 0       Divorced STD testing and prevention (HIV/chl/gon/syphilis):  Hep C: negative 2019  Skin cancer:  Discussed monitoring for atypical lesions Colorectal cancer: Colonoscopy 2006, Cologuard ordered last time but hasn't done yet Prostate cancer: PSA 4/22 0.30 Lab Results  Component Value Date   PSA 0.30 08/14/2020   The 10-year ASCVD risk score (Arnett DK, et al., 2019) is: 16%   Values used to calculate the score:     Age: 70 years     Sex: Male     Is Non-Hispanic African American: No     Diabetic: No     Tobacco smoker: No     Systolic Blood Pressure: 093 mmHg     Is BP treated: No     HDL Cholesterol: 52 mg/dL     Total Cholesterol: 194 mg/dL   Lung cancer: Low Dose CT Chest recommended if Age 7-80 years, 30 pack-year currently smoking OR have quit w/in 15years. Patient does not qualify.   AAA: The USPSTF recommends one-time screening with ultrasonography in men ages 45 to 22 years who have ever smoked ECG:  2017  Vaccines:  HPV: up to at age 25 , ask insurance if age between 15-45  Shingrix: 16-64 yo and ask insurance if covered when patient above 60 yo Pneumonia: Educated and discussed with patient. Flu:  Educated and discussed with patient.  Advanced Care Planning: A voluntary discussion about advance care planning including the explanation and discussion of advance directives.  Discussed health care proxy and Living will, and the patient was unable to identify a health care proxy.  Patient does not have a living will at present time. If patient does have living will, I have requested they bring this to the clinic to be scanned in to their chart.  Patient Active Problem List   Diagnosis Date Noted   Pure hypercholesterolemia 01/16/2019   Degenerative arthritis of cervical spine 02/23/2017   Benign prostatic hyperplasia    ED (erectile dysfunction)     Past Surgical History:  Procedure Laterality Date   HERNIA REPAIR     SKIN CANCER EXCISION     removed from face    Family History  Problem Relation Age of Onset   Cancer Father    Alcohol abuse Father    Cancer Mother    Varicose Veins Paternal Grandmother     Social History   Socioeconomic History   Marital status: Divorced    Spouse name: Not on file   Number of children: 0   Years of education: Not on file   Highest education level: Some college, no degree  Occupational History   Occupation: retired  Tobacco Use   Smoking status: Never   Smokeless tobacco: Never  Vaping Use   Vaping Use: Never used  Substance and Sexual Activity   Alcohol use: No   Drug use: No   Sexual activity: Not Currently  Other Topics Concern   Not on file  Social History Narrative   Not on file   Social Determinants of Health   Financial Resource Strain: Low Risk    Difficulty of Paying Living Expenses: Not very hard  Food Insecurity: No Food Insecurity   Worried About Charity fundraiser in the Last Year: Never true   Ran Out of Food in the Last Year: Never true  Transportation Needs: No Transportation Needs   Lack of Transportation (Medical): No   Lack of Transportation (Non-Medical): No  Physical Activity: Sufficiently Active    Days of Exercise per Week: 5 days   Minutes of Exercise per Session: 30 min  Stress: No  Stress Concern Present   Feeling of Stress : Not at all  Social Connections: Unknown   Frequency of Communication with Friends and Family: Twice a week   Frequency of Social Gatherings with Friends and Family: Three times a week   Attends Religious Services: Never   Active Member of Clubs or Organizations: No   Attends Archivist Meetings: Never   Marital Status: Not on file  Intimate Partner Violence: Not At Risk   Fear of Current or Ex-Partner: No   Emotionally Abused: No   Physically Abused: No   Sexually Abused: No     Current Outpatient Medications:    aspirin EC 81 MG tablet, Take 81 mg by mouth daily. (Patient not taking: Reported on 09/09/2020), Disp: , Rfl:    finasteride (PROSCAR) 5 MG tablet, Take 1 tablet (5 mg total) by mouth daily., Disp: 90 tablet, Rfl: 3   Multiple Vitamins-Minerals (ONE-A-DAY MENS 50+ ADVANTAGE) TABS, Take by mouth., Disp: , Rfl:    Omega-3 Fatty Acids (FISH OIL) 1000 MG CAPS, Take by mouth., Disp: , Rfl:    Turmeric 500 MG CAPS, Take by mouth., Disp: , Rfl:   No Known Allergies   Review of Systems  Constitutional: Negative.   HENT:  Positive for hearing loss.   Respiratory: Negative.    Cardiovascular: Negative.   Gastrointestinal: Negative.   Genitourinary: Negative.   Musculoskeletal:  Positive for joint pain.     Objective  Vitals:   04/28/21 1441  BP: 116/64  Pulse: 63  Resp: 16  Temp: (!) 97.5 F (36.4 C)  TempSrc: Oral  SpO2: 99%  Weight: 205 lb 1.6 oz (93 kg)  Height: 5\' 11"  (1.803 m)    Body mass index is 28.61 kg/m.  Physical Exam Constitutional:      Appearance: Normal appearance.  HENT:     Head: Normocephalic and atraumatic.     Right Ear: Tympanic membrane, ear canal and external ear normal.     Left Ear: Tympanic membrane, ear canal and external ear normal.     Ears:     Comments: Minimal cerumen  bilaterally Eyes:     Conjunctiva/sclera: Conjunctivae normal.  Cardiovascular:     Rate and Rhythm: Normal rate and regular rhythm.     Pulses:          Dorsalis pedis pulses are 2+ on the left side.  Pulmonary:     Effort: Pulmonary effort is normal.     Breath sounds: Normal breath sounds.  Musculoskeletal:     Right lower leg: No edema.     Left lower leg: No edema.     Left foot: Normal range of motion. Bunion present. No deformity, Charcot foot, foot drop or prominent metatarsal heads.  Feet:     Left foot:     Protective Sensation: 8 sites tested.  8 sites sensed.     Skin integrity: Skin integrity normal.     Toenail Condition: Left toenails are normal.  Skin:    General: Skin is warm and dry.  Neurological:     General: No focal deficit present.     Mental Status: He is alert. Mental status is at baseline.  Psychiatric:        Mood and Affect: Mood normal.        Behavior: Behavior normal.     No results found for this or any previous visit (from the past 2160 hour(s)).   Fall Risk: Fall Risk  09/09/2020 08/13/2020 05/01/2019  01/16/2019 10/16/2018  Falls in the past year? 0 0 0 0 0  Number falls in past yr: 0 - 0 0 0  Injury with Fall? 0 - 0 0 -  Risk for fall due to : No Fall Risks - - - -  Follow up Falls prevention discussed Falls prevention discussed - - -    Assessment & Plan  1. Annual physical exam/Pure hypercholesterolemia: Reviewed last lipid panel, ASCVD risk elevated at 16%. The patient does not want to take a statin, discussed lifestyle management at length.   2. Chronic gout involving toe of left foot without tophus, unspecified cause: Discussed starting a preventative medication for gout, which the patient does not want to do at this time. Discussed diet that can cause a flare and using anti-inflammatories to treat flares. Nerve exam normal with good sensation in left lower extremity.   3. Need for Tdap vaccination: Tdap administered today.  - Tdap  vaccine greater than or equal to 7yo IM   There are no diagnoses linked to this encounter.  -Prostate cancer screening and PSA options (with potential risks and benefits of testing vs not testing) were discussed along with recent recs/guidelines. -USPSTF grade A and B recommendations reviewed with patient; age-appropriate recommendations, preventive care, screening tests, etc discussed and encouraged; healthy living encouraged; see AVS for patient education given to patient -Discussed importance of 150 minutes of physical activity weekly, eat two servings of fish weekly, eat one serving of tree nuts ( cashews, pistachios, pecans, almonds.Marland Kitchen) every other day, eat 6 servings of fruit/vegetables daily and drink plenty of water and avoid sweet beverages.  -Reviewed Health Maintenance: yes

## 2021-05-12 ENCOUNTER — Other Ambulatory Visit: Payer: Self-pay | Admitting: Family Medicine

## 2021-05-12 DIAGNOSIS — Z5181 Encounter for therapeutic drug level monitoring: Secondary | ICD-10-CM

## 2021-05-12 DIAGNOSIS — N401 Enlarged prostate with lower urinary tract symptoms: Secondary | ICD-10-CM

## 2021-07-23 LAB — COLOGUARD
COLOGUARD: NEGATIVE
Cologuard: NEGATIVE

## 2021-08-13 ENCOUNTER — Other Ambulatory Visit: Payer: Self-pay | Admitting: Family Medicine

## 2021-08-13 DIAGNOSIS — N401 Enlarged prostate with lower urinary tract symptoms: Secondary | ICD-10-CM

## 2021-08-13 DIAGNOSIS — Z5181 Encounter for therapeutic drug level monitoring: Secondary | ICD-10-CM

## 2021-09-10 ENCOUNTER — Ambulatory Visit: Payer: Medicare HMO

## 2021-09-15 ENCOUNTER — Ambulatory Visit: Payer: Medicare HMO

## 2021-10-01 DIAGNOSIS — U071 COVID-19: Secondary | ICD-10-CM

## 2021-10-01 HISTORY — DX: COVID-19: U07.1

## 2021-10-15 ENCOUNTER — Ambulatory Visit (INDEPENDENT_AMBULATORY_CARE_PROVIDER_SITE_OTHER): Payer: Medicare HMO

## 2021-10-15 DIAGNOSIS — Z0000 Encounter for general adult medical examination without abnormal findings: Secondary | ICD-10-CM | POA: Diagnosis not present

## 2021-10-15 NOTE — Progress Notes (Signed)
Subjective:   Jeremiah Beck is a 71 y.o. male who presents for Medicare Annual/Subsequent preventive examination.  Virtual Visit via Telephone Note  I connected with  Jeremiah Beck on 10/15/21 at  2:45 PM EDT by telephone and verified that I am speaking with the correct person using two identifiers.  Location: Patient: home Provider: [place] Persons participating in the virtual visit: [place]   I discussed the limitations, risks, security and privacy concerns of performing an evaluation and management service by telephone and the availability of in person appointments. The patient expressed understanding and agreed to proceed.  Interactive audio and video telecommunications were attempted between this nurse and patient, however failed, due to patient having technical difficulties OR patient did not have access to video capability.  We continued and completed visit with audio only.  Some vital signs may be absent or patient reported.   Clemetine Marker, LPN   Review of Systems     Cardiac Risk Factors include: advanced age (>9mn, >>21women);male gender     Objective:    There were no vitals filed for this visit. There is no height or weight on file to calculate BMI.     10/15/2021    2:55 PM 09/09/2020    1:58 PM 02/22/2017    3:33 PM 06/23/2016   11:15 AM  Advanced Directives  Does Patient Have a Medical Advance Directive? No No No No  Would patient like information on creating a medical advance directive? No - Patient declined No - Patient declined      Current Medications (verified) Outpatient Encounter Medications as of 10/15/2021  Medication Sig   Apple Cider Vinegar 300 MG TABS Take by mouth.   finasteride (PROSCAR) 5 MG tablet TAKE 1 TABLET BY MOUTH ONCE DAILY   Garlic 1505MG TABS Take by mouth.   Multiple Vitamins-Minerals (ONE-A-DAY MENS 50+ ADVANTAGE) TABS Take by mouth.   Omega-3 Fatty Acids (FISH OIL) 1000 MG CAPS Take by mouth.   Turmeric  500 MG CAPS Take by mouth.   vitamin B-12 (CYANOCOBALAMIN) 1000 MCG tablet Take 1,000 mcg by mouth daily.   No facility-administered encounter medications on file as of 10/15/2021.    Allergies (verified) Patient has no known allergies.   History: 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   xrays Oct 2018   ED (erectile dysfunction)    Past Surgical History:  Procedure Laterality Date   HERNIA REPAIR     SKIN CANCER EXCISION     removed from face   Family History  Problem Relation Age of Onset   Cancer Father    Alcohol abuse Father    Cancer Mother    Varicose Veins Paternal Grandmother    Social History   Socioeconomic History   Marital status: Divorced    Spouse name: Not on file   Number of children: 0   Years of education: Not on file   Highest education level: Some college, no degree  Occupational History   Occupation: retired  Tobacco Use   Smoking status: Never   Smokeless tobacco: Never  Vaping Use   Vaping Use: Never used  Substance and Sexual Activity   Alcohol use: No   Drug use: No   Sexual activity: Not Currently  Other Topics Concern   Not on file  Social History Narrative   Not on file   Social Determinants of Health  Financial Resource Strain: Low Risk  (10/15/2021)   Overall Financial Resource Strain (CARDIA)    Difficulty of Paying Living Expenses: Not hard at all  Food Insecurity: No Food Insecurity (10/15/2021)   Hunger Vital Sign    Worried About Running Out of Food in the Last Year: Never true    Ran Out of Food in the Last Year: Never true  Transportation Needs: No Transportation Needs (10/15/2021)   PRAPARE - Hydrologist (Medical): No    Lack of Transportation (Non-Medical): No  Physical Activity: Inactive (10/15/2021)   Exercise Vital Sign    Days of Exercise per Week: 0 days    Minutes of Exercise per Session:  0 min  Stress: No Stress Concern Present (10/15/2021)   [place]    Feeling of Stress : Not at all  Social Connections: Socially Isolated (10/15/2021)   Social Connection and Isolation Panel [NHANES]    Frequency of Communication with Friends and Family: More than three times a week    Frequency of Social Gatherings with Friends and Family: Once a week    Attends Religious Services: Never    Marine scientist or Organizations: No    Attends Music therapist: Never    Marital Status: Divorced    Tobacco Counseling Counseling given: Not Answered   Clinical Intake:  Pre-visit preparation completed: Yes  Pain : No/denies pain     Nutritional Risks: None Diabetes: No  How often do you need to have someone help you when you read instructions, pamphlets, or other written materials from your doctor or pharmacy?: 1 - Never    Interpreter Needed?: No  Information entered by :: Clemetine Marker LPN   Activities of Daily Living    10/15/2021    2:55 PM 04/28/2021    2:30 PM  In your present state of health, do you have any difficulty performing the following activities:  Hearing? 0 1  Vision? 0 0  Difficulty concentrating or making decisions? 0 0  Walking or climbing stairs? 0 0  Dressing or bathing? 0 0  Doing errands, shopping? 0 0  Preparing Food and eating ? N   Using the Toilet? N   In the past six months, have you accidently leaked urine? N   Do you have problems with loss of bowel control? N   Managing your Medications? N   Managing your Finances? N   Housekeeping or managing your Housekeeping? N     Patient Care Team: Teodora Medici, DO as PCP - General (Internal Medicine) Ree Edman, MD (Dermatology)  Indicate any recent Medical Services you may have received from other than [facility] providers in the past year (date may be approximate).     Assessment:   This is a  routine wellness examination for Jeremiah Beck.  Hearing/Vision screen Hearing Screening - Comments:: Pt denies hearing difficulty Vision Screening - Comments:: Established with [facility]: due for exam  Dietary issues and exercise activities discussed: Current Exercise Habits: The patient does not participate in regular exercise at present, Exercise limited by: None identified   Goals Addressed   None    Depression Screen    10/15/2021    2:54 PM 04/28/2021    2:30 PM 09/09/2020    2:02 PM 09/09/2020    1:55 PM 08/13/2020    1:56 PM 05/01/2019    8:38 AM 01/16/2019    9:52 AM  PHQ 2/9  Scores  PHQ - 2 Score 0 0 0 0 0 0 0  PHQ- 9 Score  0    0 0    Fall Risk    10/15/2021    2:55 PM 04/28/2021    2:30 PM 09/09/2020    2:01 PM 08/13/2020    1:55 PM 05/01/2019    8:37 AM  Fall Risk   Falls in the past year? 0 0 0 0 0  Number falls in past yr: 0 0 0  0  Injury with Fall? 0 0 0  0  Risk for fall due to : No Fall Risks No Fall Risks No Fall Risks    Follow up Falls prevention discussed Falls prevention discussed Falls prevention discussed Falls prevention discussed     FALL RISK PREVENTION PERTAINING TO THE HOME:  Any stairs in or around the home? Yes  If so, are there any without handrails? Yes  Home free of loose throw rugs in walkways, pet beds, electrical cords, etc? Yes  Adequate lighting in your home to reduce risk of falls? Yes   ASSISTIVE DEVICES UTILIZED TO PREVENT FALLS:  Life alert? No  Use of a cane, walker or w/c? No  Grab bars in the bathroom? No  Shower chair or bench in shower? No  Elevated toilet seat or a handicapped toilet? No   TIMED UP AND GO:  Was the test performed? No . Telephonic visit.   Cognitive Function: Normal cognitive status assessed by direct observation by this Nurse Health Advisor. No abnormalities found.          Immunizations Immunization History  Administered Date(s) Administered   Influenza, High Dose Seasonal PF  02/22/2017   Influenza,inj,Quad PF,6+ Mos 02/01/2019   Pneumococcal Conjugate-13 02/22/2017   Pneumococcal Polysaccharide-23 05/01/2019   Td 01/01/2002, 05/03/2010   Tdap 04/28/2021    TDAP status: Up to date  Flu Vaccine status: Declined, Education has been provided regarding the importance of this vaccine but patient still declined. Advised may receive this vaccine at local pharmacy or Health Dept. Aware to provide a copy of the vaccination record if obtained from local pharmacy or Health Dept. Verbalized acceptance and understanding.  Pneumococcal vaccine status: Up to date  Covid-19 vaccine status: Declined, Education has been provided regarding the importance of this vaccine but patient still declined. Advised may receive this vaccine at local pharmacy or Health Dept.or vaccine clinic. Aware to provide a copy of the vaccination record if obtained from local pharmacy or Health Dept. Verbalized acceptance and understanding.  Qualifies for Shingles Vaccine? Yes   Zostavax completed No   Shingrix Completed?: No.    Education has been provided regarding the importance of this vaccine. Patient has been advised to call insurance company to determine out of pocket expense if they have not yet received this vaccine. Advised may also receive vaccine at local pharmacy or Health Dept. Verbalized acceptance and understanding.  Screening Tests Health Maintenance  Topic Date Due   Zoster Vaccines- Shingrix (1 of 2) Never done   INFLUENZA VACCINE  12/01/2021   Fecal DNA (Cologuard)  07/15/2024   TETANUS/TDAP  04/29/2031   Pneumonia Vaccine 69+ Years old  Completed   Hepatitis C Screening  Completed   HPV VACCINES  Aged Out   COVID-19 Vaccine  Discontinued    Health Maintenance  Health Maintenance Due  Topic Date Due   Zoster Vaccines- Shingrix (1 of 2) Never done    Colorectal cancer screening: Type of screening: Colonoscopy. Completed  07/15/21. Repeat every 3 years  Lung Cancer  Screening: (Low Dose CT Chest recommended if Age 44-80 years, 30 pack-year currently smoking OR have quit w/in 15years.) does not qualify.    Additional Screening:  Hepatitis C Screening: does qualify; Completed 08/29/17  Vision Screening: Recommended annual ophthalmology exams for early detection of glaucoma and other disorders of the eye. Is the patient up to date with their annual eye exam?  No  Who is the provider or what is the name of the office in which the patient attends annual eye exams? [facility]   Dental Screening: Recommended annual dental exams for proper oral hygiene  Community Resource Referral / Chronic Care Management: CRR required this visit?  No   CCM required this visit?  No      Plan:     I have personally reviewed and noted the following in the patient's chart:   Medical and social history Use of alcohol, tobacco or illicit drugs  Current medications and supplements including opioid prescriptions. Patient is not currently taking opioid prescriptions. Functional ability and status Nutritional status Physical activity Advanced directives List of other physicians Hospitalizations, surgeries, and ER visits in previous 12 months Vitals Screenings to include cognitive, depression, and falls Referrals and appointments  In addition, I have reviewed and discussed with patient certain preventive protocols, quality metrics, and best practice recommendations. A written personalized care plan for preventive services as well as general preventive health recommendations were provided to patient.     Clemetine Marker, LPN   [id number]   Nurse Notes: pt advised due for fasting labs; scheduled to see Dr. Rosana Berger on 10/27/21

## 2021-10-15 NOTE — Patient Instructions (Signed)
Mr. Lemire , Thank you for taking time to come for your Medicare Wellness Visit. I appreciate your ongoing commitment to your health goals. Please review the following plan we discussed and let me know if I can assist you in the future.   Screening recommendations/referrals: Colonoscopy: Cologuard done 07/15/21. Repeat 07/2024 Recommended yearly ophthalmology/optometry visit for glaucoma screening and checkup Recommended yearly dental visit for hygiene and checkup  Vaccinations: Influenza vaccine: due fall 2023 Pneumococcal vaccine: done 05/01/19 Tdap vaccine: done 04/28/21 Shingles vaccine: Shingrix discussed. Please contact your pharmacy for coverage information.  Covid-19: declined  Advanced directives: Please bring a copy of your health care power of attorney and living will to the office at your convenience once you have completed those documents.   Conditions/risks identified: recommend increasing physical activity   Next appointment: Follow up in one year for your annual wellness visit.   Preventive Care 40 Years and Older, Male Preventive care refers to lifestyle choices and visits with your health care provider that can promote health and wellness. What does preventive care include? A yearly physical exam. This is also called an annual well check. Dental exams once or twice a year. Routine eye exams. Ask your health care provider how often you should have your eyes checked. Personal lifestyle choices, including: Daily care of your teeth and gums. Regular physical activity. Eating a healthy diet. Avoiding tobacco and drug use. Limiting alcohol use. Practicing safe sex. Taking low doses of aspirin every day. Taking vitamin and mineral supplements as recommended by your health care provider. What happens during an annual well check? The services and screenings done by your health care provider during your annual well check will depend on your age, overall health, lifestyle  risk factors, and family history of disease. Counseling  Your health care provider may ask you questions about your: Alcohol use. Tobacco use. Drug use. Emotional well-being. Home and relationship well-being. Sexual activity. Eating habits. History of falls. Memory and ability to understand (cognition). Work and work Statistician. Screening  You may have the following tests or measurements: Height, weight, and BMI. Blood pressure. Lipid and cholesterol levels. These may be checked every 5 years, or more frequently if you are over 75 years old. Skin check. Lung cancer screening. You may have this screening every year starting at age 7 if you have a 30-pack-year history of smoking and currently smoke or have quit within the past 15 years. Fecal occult blood test (FOBT) of the stool. You may have this test every year starting at age 22. Flexible sigmoidoscopy or colonoscopy. You may have a sigmoidoscopy every 5 years or a colonoscopy every 10 years starting at age 62. Prostate cancer screening. Recommendations will vary depending on your family history and other risks. Hepatitis C blood test. Hepatitis B blood test. Sexually transmitted disease (STD) testing. Diabetes screening. This is done by checking your blood sugar (glucose) after you have not eaten for a while (fasting). You may have this done every 1-3 years. Abdominal aortic aneurysm (AAA) screening. You may need this if you are a current or former smoker. Osteoporosis. You may be screened starting at age 36 if you are at high risk. Talk with your health care provider about your test results, treatment options, and if necessary, the need for more tests. Vaccines  Your health care provider may recommend certain vaccines, such as: Influenza vaccine. This is recommended every year. Tetanus, diphtheria, and acellular pertussis (Tdap, Td) vaccine. You may need a Td booster every 10  years. Zoster vaccine. You may need this after age  15. Pneumococcal 13-valent conjugate (PCV13) vaccine. One dose is recommended after age 63. Pneumococcal polysaccharide (PPSV23) vaccine. One dose is recommended after age 49. Talk to your health care provider about which screenings and vaccines you need and how often you need them. This information is not intended to replace advice given to you by your health care provider. Make sure you discuss any questions you have with your health care provider. Document Released: 05/16/2015 Document Revised: 01/07/2016 Document Reviewed: 02/18/2015 Elsevier Interactive Patient Education  2017 [place] Prevention in the Home Falls can cause injuries. They can happen to people of all ages. There are many things you can do to make your home safe and to help prevent falls. What can I do on the outside of my home? Regularly fix the edges of walkways and driveways and fix any cracks. Remove anything that might make you trip as you walk through a door, such as a raised step or threshold. Trim any bushes or trees on the path to your home. Use bright outdoor lighting. Clear any walking paths of anything that might make someone trip, such as rocks or tools. Regularly check to see if handrails are loose or broken. Make sure that both sides of any steps have handrails. Any raised decks and porches should have guardrails on the edges. Have any leaves, snow, or ice cleared regularly. Use sand or salt on walking paths during winter. Clean up any spills in your garage right away. This includes oil or grease spills. What can I do in the bathroom? Use night lights. Install grab bars by the toilet and in the tub and shower. Do not use towel bars as grab bars. Use non-skid mats or decals in the tub or shower. If you need to sit down in the shower, use a plastic, non-slip stool. Keep the floor dry. Clean up any water that spills on the floor as soon as it happens. Remove soap buildup in the tub or shower  regularly. Attach bath mats securely with double-sided non-slip rug tape. Do not have throw rugs and other things on the floor that can make you trip. What can I do in the bedroom? Use night lights. Make sure that you have a light by your bed that is easy to reach. Do not use any sheets or blankets that are too big for your bed. They should not hang down onto the floor. Have a firm chair that has side arms. You can use this for support while you get dressed. Do not have throw rugs and other things on the floor that can make you trip. What can I do in the kitchen? Clean up any spills right away. Avoid walking on wet floors. Keep items that you use a lot in easy-to-reach places. If you need to reach something above you, use a strong step stool that has a grab bar. Keep electrical cords out of the way. Do not use floor polish or wax that makes floors slippery. If you must use wax, use non-skid floor wax. Do not have throw rugs and other things on the floor that can make you trip. What can I do with my stairs? Do not leave any items on the stairs. Make sure that there are handrails on both sides of the stairs and use them. Fix handrails that are broken or loose. Make sure that handrails are as long as the stairways. Check any carpeting to make sure  that it is firmly attached to the stairs. Fix any carpet that is loose or worn. Avoid having throw rugs at the top or bottom of the stairs. If you do have throw rugs, attach them to the floor with carpet tape. Make sure that you have a light switch at the top of the stairs and the bottom of the stairs. If you do not have them, ask someone to add them for you. What else can I do to help prevent falls? Wear shoes that: Do not have high heels. Have rubber bottoms. Are comfortable and fit you well. Are closed at the toe. Do not wear sandals. If you use a stepladder: Make sure that it is fully opened. Do not climb a closed stepladder. Make sure that  both sides of the stepladder are locked into place. Ask someone to hold it for you, if possible. Clearly mark and make sure that you can see: Any grab bars or handrails. First and last steps. Where the edge of each step is. Use tools that help you move around (mobility aids) if they are needed. These include: Canes. Walkers. Scooters. Crutches. Turn on the lights when you go into a dark area. Replace any light bulbs as soon as they burn out. Set up your furniture so you have a clear path. Avoid moving your furniture around. If any of your floors are uneven, fix them. If there are any pets around you, be aware of where they are. Review your medicines with your doctor. Some medicines can make you feel dizzy. This can increase your chance of falling. Ask your doctor what other things that you can do to help prevent falls. This information is not intended to replace advice given to you by your health care provider. Make sure you discuss any questions you have with your health care provider. Document Released: 02/13/2009 Document Revised: 09/25/2015 Document Reviewed: 05/24/2014 Elsevier Interactive Patient Education  2017 [employer].

## 2021-10-27 ENCOUNTER — Ambulatory Visit: Payer: Medicare HMO | Admitting: Internal Medicine

## 2021-10-27 NOTE — Progress Notes (Deleted)
   Established Patient Office Visit  Subjective   Patient ID: Maria Gallicchio, male    DOB: 1950-09-08  Age: 71 y.o. MRN: [id number]  No chief complaint on file.   HPI Dustin Bumbaugh is a 71 year old male presenting for follow-up on chronic medical conditions.  HLD: -Medications: Fish oil -Last lipid panel: Lipid Panel     Component Value Date/Time   CHOL 194 08/14/2020 1201   CHOL 186 01/29/2019 0920   TRIG 62 08/14/2020 1201   HDL 52 08/14/2020 1201   HDL 53 01/29/2019 0920   CHOLHDL 3.7 08/14/2020 1201   LDLCALC 127 (H) 08/14/2020 1201   LABVLDL 16 01/29/2019 0920    BPH: -Currently on finasteride 5 mg daily, -Symptoms include:  Health maintenance: -Blood work: due  -Colon cancer screening: Cologuard 3/23 negative  {History (Optional):23778}  ROS    Objective:     There were no vitals taken for this visit. {Vitals History (Optional):23777}  Physical Exam   No results found for any visits on 10/27/21.  {Labs (Optional):23779}  The 10-year ASCVD risk score (Arnett DK, et al., 2019) is: 16%    Assessment & Plan:   Problem List Items Addressed This Visit   None   No follow-ups on file.    Teodora Medici, DO

## 2021-11-11 ENCOUNTER — Other Ambulatory Visit: Payer: Self-pay | Admitting: Family Medicine

## 2021-11-11 DIAGNOSIS — N401 Enlarged prostate with lower urinary tract symptoms: Secondary | ICD-10-CM

## 2021-11-11 DIAGNOSIS — Z5181 Encounter for therapeutic drug level monitoring: Secondary | ICD-10-CM

## 2022-01-05 ENCOUNTER — Ambulatory Visit: Payer: Self-pay | Admitting: *Deleted

## 2022-01-05 ENCOUNTER — Other Ambulatory Visit: Payer: Self-pay

## 2022-01-05 ENCOUNTER — Emergency Department
Admission: EM | Admit: 2022-01-05 | Discharge: 2022-01-05 | Disposition: A | Discharge status: Home / Self Care | Payer: Medicare HMO | Attending: Emergency Medicine | Admitting: Emergency Medicine

## 2022-01-05 ENCOUNTER — Emergency Department: Payer: Medicare HMO

## 2022-01-05 DIAGNOSIS — R109 Unspecified abdominal pain: Secondary | ICD-10-CM | POA: Diagnosis present

## 2022-01-05 DIAGNOSIS — K4090 Unilateral inguinal hernia, without obstruction or gangrene, not specified as recurrent: Secondary | ICD-10-CM

## 2022-01-05 DIAGNOSIS — E876 Hypokalemia: Secondary | ICD-10-CM | POA: Diagnosis not present

## 2022-01-05 DIAGNOSIS — K4031 Unilateral inguinal hernia, with obstruction, without gangrene, recurrent: Secondary | ICD-10-CM

## 2022-01-05 LAB — URINALYSIS, ROUTINE W REFLEX MICROSCOPIC
Bacteria, UA: NONE SEEN
Bilirubin Urine: NEGATIVE
Glucose, UA: NEGATIVE mg/dL
Hgb urine dipstick: NEGATIVE
Ketones, ur: 20 mg/dL — AB
Leukocytes,Ua: NEGATIVE
Nitrite: NEGATIVE
Protein, ur: 30 mg/dL — AB
Specific Gravity, Urine: >1.046 — ABNORMAL HIGH (ref 1.005–1.030)
pH: 8 (ref 5.0–8.0)

## 2022-01-05 LAB — CBC
HCT: 45.1 % (ref 39.0–52.0)
Hemoglobin: 15.8 g/dL (ref 13.0–17.0)
MCH: 32.4 pg (ref 26.0–34.0)
MCHC: 35 g/dL (ref 30.0–36.0)
MCV: 92.6 fL (ref 80.0–100.0)
Platelets: 258 10*3/uL (ref 150–400)
RBC: 4.87 MIL/uL (ref 4.22–5.81)
RDW: 13.9 % (ref 11.5–15.5)
WBC: 7.3 10*3/uL (ref 4.0–10.5)
nRBC: 0 % (ref 0.0–0.2)

## 2022-01-05 LAB — COMPREHENSIVE METABOLIC PANEL
ALT: 19 U/L (ref 0–44)
AST: 34 U/L (ref 15–41)
Albumin: 4.2 g/dL (ref 3.5–5.0)
Alkaline Phosphatase: 79 U/L (ref 38–126)
Anion gap: 13 (ref 5–15)
BUN: 10 mg/dL (ref 8–23)
CO2: 19 mmol/L — ABNORMAL LOW (ref 22–32)
Calcium: 9.2 mg/dL (ref 8.9–10.3)
Chloride: 105 mmol/L (ref 98–111)
Creatinine, Ser: 0.96 mg/dL (ref 0.61–1.24)
GFR, Estimated: >60 mL/min (ref >60)
Glucose, Bld: 136 mg/dL — ABNORMAL HIGH (ref 70–99)
Potassium: 2.8 mmol/L — ABNORMAL LOW (ref 3.5–5.1)
Sodium: 137 mmol/L (ref 135–145)
Total Bilirubin: 1.6 mg/dL — ABNORMAL HIGH (ref 0.3–1.2)
Total Protein: 7.8 g/dL (ref 6.5–8.1)

## 2022-01-05 LAB — LIPASE, BLOOD: Lipase: 28 U/L (ref 11–51)

## 2022-01-05 MED ORDER — ONDANSETRON HCL 4 MG/2ML IJ SOLN
4.0000 mg | Freq: Once | INTRAMUSCULAR | Status: AC
  Administered 2022-01-05: 4 mg via INTRAVENOUS
  Filled 2022-01-05: qty 2

## 2022-01-05 MED ORDER — POTASSIUM CHLORIDE 10 MEQ/100ML IV SOLN
10.0000 meq | INTRAVENOUS | Status: AC
  Administered 2022-01-05 (×2): 10 meq via INTRAVENOUS
  Filled 2022-01-05 (×2): qty 100

## 2022-01-05 MED ORDER — HYDROMORPHONE HCL 1 MG/ML IJ SOLN
1.0000 mg | Freq: Once | INTRAMUSCULAR | Status: AC
  Administered 2022-01-05: 1 mg via INTRAVENOUS
  Filled 2022-01-05: qty 1

## 2022-01-05 MED ORDER — IOHEXOL 300 MG/ML  SOLN
100.0000 mL | Freq: Once | INTRAMUSCULAR | Status: AC | PRN
  Administered 2022-01-05: 100 mL via INTRAVENOUS

## 2022-01-05 NOTE — ED Notes (Signed)
E-signature pad unavailable - Pt verbalized understanding of D/C information - no additional concerns at this time.  

## 2022-01-05 NOTE — Discharge Instructions (Addendum)
Please do not do any heavy lifting and take it easy until you had your surgery.  If you have any return of significant pain bulge or hernia feels hard again please return to emergency department.

## 2022-01-05 NOTE — ED Notes (Signed)
Dr. Hampton Abbot & Dr. Starleen Blue at the bedside.

## 2022-01-05 NOTE — ED Triage Notes (Addendum)
Pt to ED via POV from home. Pt reports he had a groin hernia fixed 10 years ago and state he has started having pain in his groin and abdomen again. Pt reports intermitted constipation. Pt denies urinary symptoms. Pt states had phone visit with PCP and advised to come to ED for evaluation.

## 2022-01-05 NOTE — Telephone Encounter (Signed)
  Chief Complaint: abdominal pain from hernia  Symptoms: low abdominal pain, below navel. No appetite. Pain worsening hx hernia repair per patient Frequency: this am  Pertinent Negatives: Patient denies fever Disposition: '[x]'$ ED /'[]'$ Urgent Care (no appt availability in office) / '[]'$ Appointment(In office/virtual)/ '[]'$  [facility]/ '[]'$ Home Care/ '[]'$ Refused Recommended Disposition /'[]'$ [facility]/ '[]'$  Follow-up with PCP Additional Notes:  Na     Reason for Disposition  SEVERE abdominal pain  Answer Assessment - Initial Assessment Questions 1. ONSET:  "When did this first appear?"     This am  2. APPEARANCE: "What does it look like?"     Na  3. SIZE: "How big is it?" (inches, cm or compare to coins, fruit)     [place]  4. LOCATION: "Where exactly is the hernia located?"     Below navel 5. PATTERN: "Does the swelling come and go, or has it been constant since it started?"     Constant  6. PAIN: "Is there any pain?" If Yes, ask: "How bad is it?"  (Scale 1-10; or mild, moderate, severe)    - MILD (1-3): Doesn't interfere with normal activities, abdomen soft and not tender to touch.     - MODERATE (4-7): Interferes with normal activities or awakens from sleep, abdomen tender to touch.     - SEVERE (8-10): Excruciating pain, doubled over, unable to do any normal activities.       severe 7. DIAGNOSIS: "Have you been seen by a doctor (or NP/PA) for this?" "Did the doctor diagnose you as having a hernia?"     Yes had hernia repair 8. OTHER SYMPTOMS: "Do you have any other symptoms?" (e.g., fever, abdomen pain, vomiting)     No appetite pain in abdomen 9. PREGNANCY: "Is there any chance you are pregnant?" "When was your last menstrual period?"     na  Protocols used: [facility]

## 2022-01-05 NOTE — Consult Note (Signed)
Date of Consultation:  01/05/2022  Requesting Physician:  Acquanetta Belling, MD  Reason for Consultation:  Incarcerated left inguinal hernia  History of Present Illness: Jeremiah Beck is a 71 y.o. male presenting for evaluation of left groin pain in association with a hernia.  The patient has difficulty explaining himself well, but overall he has a history of an open left inguinal hernia repair about 10 years ago.  It's unclear when he started noticing a hernia coming back, but sounds like it's been a while already.  He reports that he uses a hernia strap to help, but yesterday he noticed that the hernia was protruding a lot more and felt firm at the groin and with a lot more tenderness.  He feels the groin lump was about a baseball size.  Denies any heavy lifting, but was doing some physical activity prior to this starting.  With this pain, also come some epigastric discomfort and also some episodes of emesis.  He also reports some difficulty voiding with the hernia being larger.  In the ED, he had workup with hypokalemia likely from emesis and normal WBC.  He had CT scan which showed a large left inguinoscrotal hernia containing colon but also a loop of small bowel which was causing a partial SBO.  This was unable to be reduced at bedside and General Surgery was consulted for evaluation.  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 Authorizing Provider  Apple Cider Vinegar 300 MG TABS Take by mouth.    [provider]  finasteride (PROSCAR) 5 MG tablet TAKE 1 TABLET BY MOUTH ONCE DAILY 11/12/21    Teodora Medici, DO  Garlic 308 MG TABS Take by mouth.    [provider]  Multiple Vitamins-Minerals (ONE-A-DAY MENS 50+ ADVANTAGE) TABS Take by mouth.    [provider]  Omega-3 Fatty Acids (FISH OIL) 1000 MG CAPS Take by mouth.    [provider]  Turmeric 500 MG CAPS Take by mouth.    [provider]  vitamin B-12 (CYANOCOBALAMIN) 1000 MCG tablet Take 1,000 mcg by mouth daily.    [provider]    Allergies: No Known Allergies  Social History:  reports that he has never smoked. He has never used smokeless tobacco. He reports that he does not drink alcohol and does not use drugs.   Family History: Family History  Problem Relation Age of Onset   Cancer Father    Alcohol abuse Father    Cancer Mother    Varicose Veins Paternal Grandmother     Review of Systems: Review of Systems  Constitutional:  Negative for chills and fever.  HENT:  Negative for hearing loss.   Respiratory:  Negative for shortness of breath.   Cardiovascular:  Negative for chest pain.  Gastrointestinal:  Positive for abdominal pain, nausea and vomiting. Negative for constipation and diarrhea.  Genitourinary:  Negative for dysuria.  Musculoskeletal:  Negative for myalgias.  Skin:  Negative for rash.  Neurological:  Negative for dizziness.  Psychiatric/Behavioral:  Negative for depression.  Physical Exam BP 139/79 (BP Location: Left Arm)   Pulse 67   Temp 98 F (36.7 C) (Oral)   Resp 16   Ht '5\' 11"'$  (1.803 m)   Wt 95 kg   SpO2 95%   BMI 29.21 kg/m  CONSTITUTIONAL: No acute distress, well nourished. HEENT:  Normocephalic, atraumatic, extraocular motion intact. NECK: Trachea is midline, and there is no jugular venous distension. RESPIRATORY:  Normal respiratory effort without pathologic use of accessory muscles. CARDIOVASCULAR: Regular rhythm and rate. GI: The abdomen is soft, non-distended, with some tenderness in the left groin.  The patient  has a large left inguinoscrotal hernia.  At the groin itself, there is a firmer lump, about 5 cm in size.  After careful manipulation, pain medication, and mild Trendelenburg position, this part of the hernia was able to be reduced.  The patient's more chronic portion was soft and partially reduced, though there's still a component of likely colon which extends to the scrotum.  However, much softer and without tenderness after the initial reduction of small bowel.  He also has a small reducible umbilical hernia, about 1 cm size. MUSCULOSKELETAL:  Normal muscle strength and tone in all four extremities.  No peripheral edema or cyanosis. SKIN: Skin turgor is normal. There are no pathologic skin lesions.  NEUROLOGIC:  Motor and sensation is grossly normal.  Cranial nerves are grossly intact. PSYCH:  Alert and oriented to person, place and time. Affect is normal.  Laboratory Analysis: Results for orders placed or performed during the hospital encounter of 01/05/22 (from the past 24 hour(s))  Lipase, blood     Status: None   Collection Time: 01/05/22 12:28 PM  Result Value Ref Range   Lipase 28 11 - 51 U/L  Comprehensive metabolic panel     Status: Abnormal   Collection Time: 01/05/22 12:28 PM  Result Value Ref Range   Sodium 137 135 - 145 mmol/L   Potassium 2.8 (L) 3.5 - 5.1 mmol/L   Chloride 105 98 - 111 mmol/L   CO2 19 (L) 22 - 32 mmol/L   Glucose, Bld 136 (H) 70 - 99 mg/dL   BUN 10 8 - 23 mg/dL   Creatinine, Ser 0.96 0.61 - 1.24 mg/dL   Calcium 9.2 8.9 - 10.3 mg/dL   Total Protein 7.8 6.5 - 8.1 g/dL   Albumin 4.2 3.5 - 5.0 g/dL   AST 34 15 - 41 U/L   ALT 19 0 - 44 U/L   Alkaline Phosphatase 79 38 - 126 U/L   Total Bilirubin 1.6 (H) 0.3 - 1.2 mg/dL   GFR, Estimated >60 >60 mL/min   Anion gap 13 5 - 15  CBC     Status: None   Collection Time: 01/05/22 12:28 PM  Result Value Ref Range   WBC 7.3 4.0 - 10.5 K/uL   RBC 4.87 4.22 - 5.81 MIL/uL   Hemoglobin 15.8 13.0 - 17.0 g/dL   HCT  45.1 39.0 - 52.0 %   MCV 92.6 80.0 - 100.0 fL   MCH 32.4 26.0 - 34.0 pg   MCHC 35.0 30.0 - 36.0 g/dL   RDW 13.9 11.5 - 15.5 %   Platelets 258 150 - 400 K/uL   nRBC 0.0 0.0 - 0.2 %  Urinalysis, Routine w reflex microscopic     Status: Abnormal   Collection Time: 01/05/22  8:10 PM  Result Value Ref Range   Color, Urine YELLOW (A) YELLOW   APPearance CLEAR (A) CLEAR   Specific  Gravity, Urine >1.046 (H) 1.005 - 1.030   pH 8.0 5.0 - 8.0   Glucose, UA NEGATIVE NEGATIVE mg/dL   Hgb urine dipstick NEGATIVE NEGATIVE   Bilirubin Urine NEGATIVE NEGATIVE   Ketones, ur 20 (A) NEGATIVE mg/dL   Protein, ur 30 (A) NEGATIVE mg/dL   Nitrite NEGATIVE NEGATIVE   Leukocytes,Ua NEGATIVE NEGATIVE   RBC / HPF 0-5 0 - 5 RBC/hpf   WBC, UA 0-5 0 - 5 WBC/hpf   Bacteria, UA NONE SEEN NONE SEEN   Squamous Epithelial / LPF 0-5 0 - 5   Mucus PRESENT     Imaging: CT ABDOMEN PELVIS W CONTRAST  Result Date: 01/05/2022 CLINICAL DATA:  Bowel obstruction suspected Groin and abdominal pain, prior hernia post repair. EXAM: CT ABDOMEN AND PELVIS WITH CONTRAST TECHNIQUE: Multidetector CT imaging of the abdomen and pelvis was performed using the standard protocol following bolus administration of intravenous contrast. RADIATION DOSE REDUCTION: This exam was performed according to the departmental dose-optimization program which includes automated exposure control, adjustment of the mA and/or kV according to patient size and/or use of iterative reconstruction technique. CONTRAST:  112m OMNIPAQUE IOHEXOL 300 MG/ML  SOLN COMPARISON:  None Available. FINDINGS: Lower chest: No pleural effusion or focal airspace disease. There is wall thickening of the distal esophagus with small hiatal hernia. Hepatobiliary: No focal liver abnormality is seen. No gallstones, gallbladder wall thickening, or biliary dilatation. Pancreas: No ductal dilatation or inflammation. Spleen: Normal in size without focal abnormality. Adrenals/Urinary Tract:  Normal adrenal glands. No hydronephrosis no renal calculi. No evidence of focal renal lesion. Moderately distended urinary bladder, slightly displaced to the right due to left inguinal hernia. Scattered diverticula at the bladder base. Stomach/Bowel: There is a moderate size left inguinal hernia that contains both small and large bowel. The small bowel in the hernia sac is fluid-filled and prominent. Mild mesenteric edema with moderate free fluid dependently in the hernia sac. Small bowel just proximal to the hernia is fluid-filled and prominent measuring up to 2.7 cm dimension. Small bowel distal to the hernia is decompressed. Stomach is mildly distended, hiatal hernia. There is no bowel pneumatosis. Segment of sigmoid colon extends into the left inguinal hernia, but appears nonobstructed nor inflamed. There is a small to moderate volume of colonic stool. High-riding cecum in the right mid abdomen. Normal diminutive appendix tentatively visualized. No appendicitis. Vascular/Lymphatic: Mild aortic atherosclerosis without aneurysm. Circumaortic left renal vein. The venous component coursing anterior to the aorta may be stenotic, as there left upper quadrant venous collaterals. No evidence of thrombus. Patent portal vein. No adenopathy. Reproductive: Prostate not well-defined. Other: Left inguinal hernia as described above containing large and small bowel. Moderate free fluid in the hernia sac. There is no other free fluid in the abdomen or pelvis. No free air or perforation. Musculoskeletal: There are no acute or suspicious osseous abnormalities. Lower lumbar facet hypertrophy. IMPRESSION: 1. Findings suspicious for partial small-bowel obstruction related to left inguinal hernia. No evidence of perforation or pneumatosis. 2. Segment of sigmoid colon extends into the left inguinal hernia, but appears nonobstructed. 3. Moderately distended urinary bladder, slightly displaced to the right due to left inguinal hernia.  Scattered bladder diverticula suggest chronic bladder outlet obstruction. 4. Distal esophageal wall thickening with small hiatal hernia, can be seen with reflux or esophagitis. Aortic Atherosclerosis (ICD10-I70.0). Electronically Signed   By: MKeith RakeM.D.   On: 01/05/2022 18:59    Assessment and Plan: This is a 71y.o. male with a large left  recurrent inguinoscrotal hernia.  --The patient has a recurrence of his left inguinal hernia.  It is unclear how long exactly he's had it for, but he uses a hernia strap/truss to help hold up the scrotum.  The patient's small bowel component to the hernia was successfully reduced at bedside, with immediate improvement in the patient's pain and symptoms.  I was able to partially reduce part of the colon as well, but I do feel that has been chronically incarcerated.  The colon nonetheless was very soft, without any firmness or tenderness on further exam.   --Discussed with the patient the possible options which included being admitted to the hospital and add him for robotic/possible open left inguinal hernia repair tomorrow 01/06/22, vs discharge home with close outpatient follow up this week and schedule him for outpatient surgery on 01/14/22.  He has opted for outpatient follow up and scheduling.  Gave him strict activity precautions and restrictions.  He understands this and will abide by them. --Follow up with me as outpatient.  Will have our office contact him tomorrow for appointment this week.   Melvyn Neth, MD [facility] Pg:  [phone]

## 2022-01-05 NOTE — Telephone Encounter (Signed)
FYI

## 2022-01-05 NOTE — ED Notes (Signed)
Patient to CT at this time

## 2022-01-05 NOTE — ED Provider Notes (Signed)
[facility] Provider Note    Event Date/Time   First MD Initiated Contact with Patient 01/05/22 1716     (approximate)   History   Abdominal Pain   HPI  Marius Betts is a 71 y.o. male past medical history of BPH, prior left inguinal hernia repair presents with abdominal pain pain and hernia.  Patient started having abdominal pain around 11 today.  Is in the epigastric region and diffuse.  Has had multiple episodes of emesis.  No BM today.  He also noticed a bulge in the left inguinal region.  Has had hernia with bulge before which much more significant and is painful.  Denies fevers or has had some chills.     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 --      Excl. in [place]? --     Most recent vital signs: Vitals:   01/05/22 1945 01/05/22 2030  BP: (!) 158/68 139/79  Pulse: (!) 51 67  Resp: 15 16  Temp:  98 F (36.7 C)  SpO2: 98% 95%     General: Awake, looks uncomfortable, diaphoretic CV:  Good peripheral perfusion.  Resp:  Normal effort.  Abd:  No distention.  Abdomen is soft there is a large left inguinal hernia that is firm and tender to palpation not easily reducible Neuro:              Awake, Alert, Oriented x 3  Other:     ED Results / Procedures / Treatments  Labs (all labs ordered are listed, but only abnormal results are displayed) Labs Reviewed  COMPREHENSIVE METABOLIC PANEL - Abnormal; Notable for the following components:      Result Value   Potassium 2.8 (*)    CO2 19 (*)    Glucose, Bld 136 (*)    Total Bilirubin 1.6 (*)    All other components within normal limits  URINALYSIS, ROUTINE W REFLEX MICROSCOPIC - Abnormal; Notable for the following components:   Color, Urine YELLOW (*)    APPearance CLEAR (*)    Specific Gravity, Urine >1.046 (*)    Ketones, ur 20 (*)    Protein, ur 30 (*)    All other components within normal limits  LIPASE, BLOOD  CBC     EKG    RADIOLOGY ET abdomen pelvis reviewed interpreted myself shows large left inguinal hernia findings of early SBO  PROCEDURES:  Critical Care performed: Yes, see critical care procedure note(s)  Procedures  The patient is on the cardiac monitor to evaluate for evidence of arrhythmia and/or significant heart rate changes.   MEDICATIONS ORDERED IN ED: Medications  potassium chloride 10 mEq in 100 mL IVPB (10 mEq Intravenous New Bag/Given 01/05/22 2011)  ondansetron (ZOFRAN) injection 4 mg (4 mg Intravenous Given 01/05/22 1834)  HYDROmorphone (DILAUDID) injection 1 mg (1 mg Intravenous Given 01/05/22 1834)  iohexol (OMNIPAQUE) 300 MG/ML solution 100 mL (100 mLs Intravenous Contrast Given 01/05/22 1842)  HYDROmorphone (DILAUDID) injection 1 mg (1 mg Intravenous Given 01/05/22 2011)     IMPRESSION / MDM / [place] / ED COURSE  I reviewed the triage vital signs and the nursing notes.                              Patient's presentation is most consistent with acute presentation with potential threat to life or bodily function.  Differential diagnosis includes, but is not limited to, hernia, strangulated hernia, SBO  Patient is a 71 year old male presents with abdominal pain  vomiting and left inguinal hernia.  He looks unwell on evaluation abdomen is soft nontender but he has a large left inguinal hernia that is firm and not easily reducible.  We will give pain control Zofran and obtain CT abdomen pelvis and attempt to reduce again to pain is better controlled.  Also notable for hypokalemia with potassium 2.8 likely from vomiting, bicarb low at 19.  Will supplement with IV potassium.  Keep patient NPO.  I was unable to reduce the patient's inguinal hernia.  Dr. Hampton Abbot evaluated the patient at the bedside and was able to significantly reduce the majority of the hernia.  He gave the patient the option of admission for extended surgery versus discharge home with close return precautions and outpatient surgery and the patient for the latter.  He will follow-up for surgery next week.     FINAL CLINICAL IMPRESSION(S) / ED DIAGNOSES   Final diagnoses:  Unilateral inguinal hernia without obstruction or gangrene, recurrence not specified     Rx / DC Orders   ED Discharge Orders     None        Note:  This document was prepared using Dragon voice recognition software and may include unintentional dictation errors.   Rada Hay, MD 01/05/22 2100

## 2022-01-06 DIAGNOSIS — K4030 Unilateral inguinal hernia, with obstruction, without gangrene, not specified as recurrent: Secondary | ICD-10-CM | POA: Insufficient documentation

## 2022-01-07 ENCOUNTER — Telehealth: Payer: Self-pay | Admitting: Surgery

## 2022-01-07 NOTE — Telephone Encounter (Signed)
Patient has been advised of Pre-Admission date/time, and Surgery date at [facility]  This information is provided  to the patient while in office on 01/08/22 for follow up prior to his surgery.   Surgery Date: 01/14/22 Preadmission Testing Date: 01/11/22 (phone 8a-1p)  Patient has been made aware to call [phone], between 1-3:00pm the day before surgery, to find out what time to arrive for surgery.

## 2022-01-08 ENCOUNTER — Ambulatory Visit: Payer: Medicare HMO | Admitting: Surgery

## 2022-01-08 ENCOUNTER — Encounter: Payer: Self-pay | Admitting: Surgery

## 2022-01-08 ENCOUNTER — Other Ambulatory Visit: Payer: Self-pay

## 2022-01-08 VITALS — BP 133/78 | HR 54 | Temp 98.6°F | Ht 71.0 in | Wt 198.2 lb

## 2022-01-08 DIAGNOSIS — K429 Umbilical hernia without obstruction or gangrene: Secondary | ICD-10-CM

## 2022-01-08 DIAGNOSIS — K4031 Unilateral inguinal hernia, with obstruction, without gangrene, recurrent: Secondary | ICD-10-CM | POA: Diagnosis not present

## 2022-01-08 DIAGNOSIS — K4030 Unilateral inguinal hernia, with obstruction, without gangrene, not specified as recurrent: Secondary | ICD-10-CM

## 2022-01-08 NOTE — H&P (View-Only) (Signed)
01/08/2022  History of Present Illness: Jeremiah Beck is a 71 y.o. male presents for follow up of an incarcerated left inguinal hernia.  The patient presented to the emergency room on 01/05/2022 with acute onset that same day of left groin pain and was found to have a incarcerated left inguinal hernia containing small bowel and colon.  The small bowel portion was causing a small bowel obstruction.  I was able to successfully reduce the small bowel component at bedside.  The large bowel component I think is a chronic hernia and it was not reducible but it was soft without any further tenderness.  The patient opted for outpatient follow-up instead of hernia repair on the next day and presents today for follow-up.  He is currently scheduled for surgery on 01/14/2022 for a robotic assisted incarcerated recurrent left inguinal hernia repair, possible open, possible bilateral, and open reducible umbilical hernia repair.  Patient denies any worsening symptoms.  He has been using a hernia brief and strap.  Denies any fevers, chills, chest pain, shortness of breath, nausea, vomiting.  Denies any issues at the umbilicus and denies any bulging on the right groin.  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 Authorizing Provider  Apple Cider Vinegar 300 MG TABS Take by mouth.   Yes [provider]  finasteride (PROSCAR) 5 MG tablet TAKE 1 TABLET BY MOUTH ONCE DAILY 11/12/21  Yes Andrews, Elisabeth, DO  Garlic 100 MG TABS Take by mouth.   Yes [provider]  Multiple Vitamins-Minerals (ONE-A-DAY MENS  50+ ADVANTAGE) TABS Take by mouth.   Yes [provider]  Omega-3 Fatty Acids (FISH OIL) 1000 MG CAPS Take by mouth.   Yes [provider]  Turmeric 500 MG CAPS Take by mouth.   Yes [provider]  vitamin B-12 (CYANOCOBALAMIN) 1000 MCG tablet Take 1,000 mcg by mouth daily.   Yes [provider]    Allergies: No Known Allergies  Review of Systems: Review of Systems  Constitutional:  Negative for chills and fever.  HENT:  Negative for hearing loss.   Respiratory:  Negative for shortness of breath.   Cardiovascular:  Negative for chest pain.  Gastrointestinal:  Positive for constipation. Negative for abdominal pain, diarrhea, nausea and vomiting.  Genitourinary:  Negative for dysuria.  Musculoskeletal:  Negative for myalgias.  Skin:  Negative for rash.  Neurological:  Negative for dizziness.  Psychiatric/Behavioral:  Negative for depression.     Physical Exam BP 133/78   Pulse (!) 54   Temp 98.6 F (37 C) (Oral)   Ht 5' 11" (1.803 m)   Wt 198 lb 3.2 oz (89.9 kg)   SpO2 100%   BMI 27.64 kg/m  CONSTITUTIONAL: No acute distress, well-nourished HEENT:  Normocephalic, atraumatic, extraocular motion intact. NECK: Trachea is midline, no jugular venous distention. RESPIRATORY:  Lungs are clear, and breath sounds are equal bilaterally. Normal respiratory effort without pathologic use of accessory muscles. CARDIOVASCULAR: Heart is regular without murmurs, gallops, or rubs. GI: The abdomen is soft, nondistended, nontender to palpation.    Patient has an incarcerated left inguinal hernia containing colon.  There is no evidence of small bowel bulging at this point and the area is soft with no overlying skin changes.  Hernia is large and extends to the scrotum.  No palpable hernia on the right groin.  Patient has a 1 cm reducible umbilical hernia with no tenderness.  MUSCULOSKELETAL: No peripheral edema, normal gait. NEUROLOGIC:  Motor and sensation is  grossly normal.  Cranial nerves are grossly intact. PSYCH:  Alert and oriented to person, place and time. Affect is normal.  Labs/Imaging: Labs from 01/05/22: Na 137, K 2.8, Cl 105, CO2 19, BUN 10, Cr 0.96.  Total bili 1.6, AST 34, ALT 19, Alk Phos 79.  WBC 7.3, Hgb 15.8, Hct 45.1, Plt 258  CT abdomen/pelvis on 01/05/22: IMPRESSION: 1. Findings suspicious for partial small-bowel obstruction related to left inguinal hernia. No evidence of perforation or pneumatosis. 2. Segment of sigmoid colon extends into the left inguinal hernia, but appears nonobstructed. 3. Moderately distended urinary bladder, slightly displaced to the right due to left inguinal hernia. Scattered bladder diverticula suggest chronic bladder outlet obstruction. 4. Distal esophageal wall thickening with small hiatal hernia, can be seen with reflux or esophagitis.   Aortic Atherosclerosis (ICD10-I70.0).  Assessment and Plan: This is a 71 y.o. male with a recurrent incarcerated left inguinal hernia and then you reducible umbilical hernia.  - Patient is currently scheduled for surgery on 01/14/2022 for robotic assisted left inguinal hernia repair, possible open, possible bilateral, and open umbilical hernia repair.  Discussed with patient again the surgery at length and that we would attempt this minimally invasive as he is already had an open repair in the past.  However discussed with him that given the chronicity of his hernia and the colon is involved, that we may have to convert to an open surgery.  Discussed with him also that while inside his abdomen if there is any hint of a right inguinal hernia formation then we will be repairing the right side as well.  Reviewed with him the risks of bleeding, infection, injury to surrounding structures, postoperative activity restrictions, pain control, that this an outpatient surgery he is willing to proceed. - In the meantime, reminded him to maintain activity restrictions as  previously instructed and continue wearing his hernia belt/underwear. - All of his questions have been answered.  I spent 40 minutes dedicated to the care of this patient on the date of this encounter to include pre-visit review of records, face-to-face time with the patient discussing diagnosis and management, and any post-visit coordination of care.   Melvyn Neth, [facility]

## 2022-01-08 NOTE — Patient Instructions (Addendum)
Please call the office if you have questions or concerns.   Inguinal Hernia, Adult An inguinal hernia develops when fat or the intestines push through a weak spot in a muscle where the leg meets the lower abdomen (groin). This creates a bulge. This kind of hernia could also be: In the scrotum, if you are male. In folds of skin around the vagina, if you are male. There are three types of inguinal hernias: Hernias that can be pushed back into the abdomen (are reducible). This type rarely causes pain. Hernias that are not reducible (are incarcerated). Hernias that are not reducible and lose their blood supply (are strangulated). This type of hernia requires emergency surgery. What are the causes? This condition is caused by having a weak spot in the muscles or tissues in your groin. This develops over time. The hernia may poke through the weak spot when you suddenly strain your lower abdominal muscles, such as when you: Lift a heavy object. Strain to have a bowel movement. Constipation can lead to straining. Cough. What increases the risk? This condition is more likely to develop in: Males. Pregnant females. People who: Are overweight. Work in jobs that require long periods of standing or heavy lifting. Have had an inguinal hernia before. Smoke or have lung disease. These factors can lead to long-term (chronic) coughing. What are the signs or symptoms? Symptoms may depend on the size of the hernia. Often, a small inguinal hernia has no symptoms. Symptoms of a larger hernia may include: A bulge in the groin area. This is easier to see when standing. It might not be visible when lying down. Pain or burning in the groin. This may get worse when lifting, straining, or coughing. A dull ache or a feeling of pressure in the groin. An unusual bulge in the scrotum, in males. Symptoms of a strangulated inguinal hernia may include: A bulge in your groin that is very painful and tender to the  touch. A bulge that turns red or purple. Fever, nausea, and vomiting. Inability to have a bowel movement or to pass gas. How is this diagnosed? This condition is diagnosed based on your symptoms, your medical history, and a physical exam. Your health care provider may feel your groin area and ask you to cough. How is this treated? Treatment depends on the size of your hernia and whether you have symptoms. If you do not have symptoms, your health care provider may have you watch your hernia carefully and have you come in for follow-up visits. If your hernia is large or if you have symptoms, you may need surgery to repair the hernia. Follow these instructions at home: Lifestyle Avoid lifting heavy objects. Avoid standing for long periods of time. Do not use any products that contain nicotine or tobacco. These products include cigarettes, chewing tobacco, and vaping devices, such as e-cigarettes. If you need help quitting, ask your health care provider. Maintain a healthy weight. Preventing constipation You may need to take these actions to prevent or treat constipation: Drink enough fluid to keep your urine pale yellow. Take over-the-counter or prescription medicines. Eat foods that are high in fiber, such as beans, whole grains, and fresh fruits and vegetables. Limit foods that are high in fat and processed sugars, such as fried or sweet foods. General instructions You may try to push the hernia back in place by very gently pressing on it while lying down. Do not try to force the bulge back in if it will not push  in easily. Watch your hernia for any changes in shape, size, or color. Get help right away if you notice any changes. Take over-the-counter and prescription medicines only as told by your health care provider. Keep all follow-up visits. This is important. Contact a health care provider if: You have a fever or chills. You develop new symptoms. Your symptoms get worse. Get help  right away if: You have pain in your groin that suddenly gets worse. You have a bulge in your groin that: Suddenly gets bigger and does not get smaller. Becomes red or purple or painful to the touch. You are a man and you have a sudden pain in your scrotum, or the size of your scrotum suddenly changes. You cannot push the hernia back in place by very gently pressing on it when you are lying down. You have nausea or vomiting that does not go away. You have a fast heartbeat. You cannot have a bowel movement or pass gas. These symptoms may represent a serious problem that is an emergency. Do not wait to see if the symptoms will go away. Get medical help right away. Call your local emergency services (911 in the [place] Summary An inguinal hernia develops when fat or the intestines push through a weak spot in a muscle where your leg meets your lower abdomen (groin). This condition is caused by having a weak spot in muscles or tissues in your groin. Symptoms may depend on the size of the hernia, and they may include pain or swelling in your groin. A small inguinal hernia often has no symptoms. Treatment may not be needed if you do not have symptoms. If you have symptoms or a large hernia, you may need surgery to repair the hernia. Avoid lifting heavy objects. Also, avoid standing for long periods of time. This information is not intended to replace advice given to you by your health care provider. Make sure you discuss any questions you have with your health care provider. Document Revised: 12/18/2019 Document Reviewed: 12/18/2019 Elsevier Patient Education  [employer].

## 2022-01-08 NOTE — Progress Notes (Signed)
01/08/2022  History of Present Illness: Jeremiah Beck is a 70 y.o. male presents for follow up of an incarcerated left inguinal hernia.  The patient presented to the emergency room on 01/05/2022 with acute onset that same day of left groin pain and was found to have a incarcerated left inguinal hernia containing small bowel and colon.  The small bowel portion was causing a small bowel obstruction.  I was able to successfully reduce the small bowel component at bedside.  The large bowel component I think is a chronic hernia and it was not reducible but it was soft without any further tenderness.  The patient opted for outpatient follow-up instead of hernia repair on the next day and presents today for follow-up.  He is currently scheduled for surgery on 01/14/2022 for a robotic assisted incarcerated recurrent left inguinal hernia repair, possible open, possible bilateral, and open reducible umbilical hernia repair.  Patient denies any worsening symptoms.  He has been using a hernia brief and strap.  Denies any fevers, chills, chest pain, shortness of breath, nausea, vomiting.  Denies any issues at the umbilicus and denies any bulging on the right groin.  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 Authorizing Provider  Apple Cider Vinegar 300 MG TABS Take by mouth.   Yes [provider]  finasteride (PROSCAR) 5 MG tablet TAKE 1 TABLET BY MOUTH ONCE DAILY 11/12/21  Yes Andrews, Elisabeth, DO  Garlic 100 MG TABS Take by mouth.   Yes [provider]  Multiple Vitamins-Minerals (ONE-A-DAY MENS  50+ ADVANTAGE) TABS Take by mouth.   Yes [provider]  Omega-3 Fatty Acids (FISH OIL) 1000 MG CAPS Take by mouth.   Yes [provider]  Turmeric 500 MG CAPS Take by mouth.   Yes [provider]  vitamin B-12 (CYANOCOBALAMIN) 1000 MCG tablet Take 1,000 mcg by mouth daily.   Yes [provider]    Allergies: No Known Allergies  Review of Systems: Review of Systems  Constitutional:  Negative for chills and fever.  HENT:  Negative for hearing loss.   Respiratory:  Negative for shortness of breath.   Cardiovascular:  Negative for chest pain.  Gastrointestinal:  Positive for constipation. Negative for abdominal pain, diarrhea, nausea and vomiting.  Genitourinary:  Negative for dysuria.  Musculoskeletal:  Negative for myalgias.  Skin:  Negative for rash.  Neurological:  Negative for dizziness.  Psychiatric/Behavioral:  Negative for depression.     Physical Exam BP 133/78   Pulse (!) 54   Temp 98.6 F (37 C) (Oral)   Ht 5' 11" (1.803 m)   Wt 198 lb 3.2 oz (89.9 kg)   SpO2 100%   BMI 27.64 kg/m  CONSTITUTIONAL: No acute distress, well-nourished HEENT:  Normocephalic, atraumatic, extraocular motion intact. NECK: Trachea is midline, no jugular venous distention. RESPIRATORY:  Lungs are clear, and breath sounds are equal bilaterally. Normal respiratory effort without pathologic use of accessory muscles. CARDIOVASCULAR: Heart is regular without murmurs, gallops, or rubs. GI: The abdomen is soft, nondistended, nontender to palpation.    Patient has an incarcerated left inguinal hernia containing colon.  There is no evidence of small bowel bulging at this point and the area is soft with no overlying skin changes.  Hernia is large and extends to the scrotum.  No palpable hernia on the right groin.  Patient has a 1 cm reducible umbilical hernia with no tenderness.  MUSCULOSKELETAL: No peripheral edema, normal gait. NEUROLOGIC:  Motor and sensation is  grossly normal.  Cranial nerves are grossly intact. PSYCH:  Alert and oriented to person, place and time. Affect is normal.  Labs/Imaging: Labs from 01/05/22: Na 137, K 2.8, Cl 105, CO2 19, BUN 10, Cr 0.96.  Total bili 1.6, AST 34, ALT 19, Alk Phos 79.  WBC 7.3, Hgb 15.8, Hct 45.1, Plt 258  CT abdomen/pelvis on 01/05/22: IMPRESSION: 1. Findings suspicious for partial small-bowel obstruction related to left inguinal hernia. No evidence of perforation or pneumatosis. 2. Segment of sigmoid colon extends into the left inguinal hernia, but appears nonobstructed. 3. Moderately distended urinary bladder, slightly displaced to the right due to left inguinal hernia. Scattered bladder diverticula suggest chronic bladder outlet obstruction. 4. Distal esophageal wall thickening with small hiatal hernia, can be seen with reflux or esophagitis.   Aortic Atherosclerosis (ICD10-I70.0).  Assessment and Plan: This is a 71 y.o. male with a recurrent incarcerated left inguinal hernia and then you reducible umbilical hernia.  - Patient is currently scheduled for surgery on 01/14/2022 for robotic assisted left inguinal hernia repair, possible open, possible bilateral, and open umbilical hernia repair.  Discussed with patient again the surgery at length and that we would attempt this minimally invasive as he is already had an open repair in the past.  However discussed with him that given the chronicity of his hernia and the colon is involved, that we may have to convert to an open surgery.  Discussed with him also that while inside his abdomen if there is any hint of a right inguinal hernia formation then we will be repairing the right side as well.  Reviewed with him the risks of bleeding, infection, injury to surrounding structures, postoperative activity restrictions, pain control, that this an outpatient surgery he is willing to proceed. - In the meantime, reminded him to maintain activity restrictions as  previously instructed and continue wearing his hernia belt/underwear. - All of his questions have been answered.  I spent 40 minutes dedicated to the care of this patient on the date of this encounter to include pre-visit review of records, face-to-face time with the patient discussing diagnosis and management, and any post-visit coordination of care.   Melvyn Neth, [facility]

## 2022-01-11 ENCOUNTER — Encounter
Admission: RE | Admit: 2022-01-11 | Discharge: 2022-01-11 | Disposition: A | Discharge status: Home / Self Care | Payer: Medicare HMO | Source: Ambulatory Visit | Attending: Surgery | Admitting: Surgery

## 2022-01-11 VITALS — Ht 71.0 in | Wt 205.0 lb

## 2022-01-11 DIAGNOSIS — E7800 Pure hypercholesterolemia, unspecified: Secondary | ICD-10-CM

## 2022-01-11 HISTORY — DX: Malignant (primary) neoplasm, unspecified: C80.1

## 2022-01-11 NOTE — Patient Instructions (Signed)
Your procedure is scheduled on: 01/14/22 Report to [place]. To find out your arrival time please call [phone] between 1PM - 3PM on 01/13/22.  Remember: Instructions that are not followed completely may result in serious medical risk, up to and including death, or upon the discretion of your surgeon and anesthesiologist your surgery may need to be rescheduled.     _X__ 1. Do not eat food after midnight the night before your procedure.                 No gum chewing or hard candies. You may drink clear liquids up to 2 hours                 before you are scheduled to arrive for your surgery- DO not drink clear                 liquids within 2 hours of the start of your surgery.                 Clear Liquids include:  water, apple juice without pulp, clear carbohydrate                 drink such as Clearfast or Gatorade, Black Coffee or Tea (Do not add                 anything to coffee or tea). Diabetics water only  __X__2.  On the morning of surgery brush your teeth with toothpaste and water, you                 may rinse your mouth with mouthwash if you wish.  Do not swallow any              toothpaste of mouthwash.     _X__ 3.  No Alcohol for 24 hours before or after surgery.   _X__ 4.  Do Not Smoke or use e-cigarettes For 24 Hours Prior to Your Surgery.                 Do not use any chewable tobacco products for at least 6 hours prior to                 surgery.  ____  5.  Bring all medications with you on the day of surgery if instructed.   __X__  6.  Notify your doctor if there is any change in your medical condition      (cold, fever, infections).     Do not wear jewelry, make-up, hairpins, clips or nail polish. Do not wear lotions, powders, or perfumes.  Do not shave body hair 48 hours prior to surgery. Men may shave face and neck. Do not bring valuables to the hospital.    [facility] is not responsible for any  belongings or valuables.  Contacts, dentures/partials or body piercings may not be worn into surgery. Bring a case for your contacts, glasses or hearing aids, a denture cup will be supplied. Leave your suitcase in the car. After surgery it may be brought to your room. For patients admitted to the hospital, discharge time is determined by your treatment team.   Patients discharged the day of surgery will not be allowed to drive home.    __X__ Take these medicines the morning of surgery with A SIP OF WATER:    1. finasteride (PROSCAR) 5 MG tablet  2.   3.  4.  5.  6.  ____ Fleet Enema (as directed)   ____ Use CHG Soap/SAGE wipes as directed  ____ Use inhalers on the day of surgery  ____ Stop metformin/Janumet/Farxiga 2 days prior to surgery    ____ Take 1/2 of usual insulin dose the night before surgery. No insulin the morning          of surgery.   ____ Stop Blood Thinners Coumadin/Plavix/Xarelto/Pleta/Pradaxa/Eliquis/Effient/Aspirin  on   Or contact your Surgeon, Cardiologist or Medical Doctor regarding  ability to stop your blood thinners  __X__ Stop Anti-inflammatories 7 days before surgery such as Advil, Ibuprofen, Motrin,  BC or Goodies Powder, Naprosyn, Naproxen, Aleve, Aspirin    __X__ Stop all herbals and supplements, fish oil or vitamins  until after surgery.    ____ Bring C-Pap to the hospital.

## 2022-01-12 ENCOUNTER — Inpatient Hospital Stay: Admission: RE | Admit: 2022-01-12 | Payer: Medicare HMO | Source: Ambulatory Visit

## 2022-01-12 ENCOUNTER — Other Ambulatory Visit: Payer: Medicare HMO

## 2022-01-12 ENCOUNTER — Encounter
Admission: RE | Admit: 2022-01-12 | Discharge: 2022-01-12 | Disposition: A | Discharge status: Home / Self Care | Payer: Medicare HMO | Source: Ambulatory Visit | Attending: Surgery | Admitting: Surgery

## 2022-01-12 DIAGNOSIS — Z01812 Encounter for preprocedural laboratory examination: Secondary | ICD-10-CM

## 2022-01-12 DIAGNOSIS — E7800 Pure hypercholesterolemia, unspecified: Secondary | ICD-10-CM | POA: Insufficient documentation

## 2022-01-12 DIAGNOSIS — E876 Hypokalemia: Secondary | ICD-10-CM | POA: Diagnosis not present

## 2022-01-12 DIAGNOSIS — Z01818 Encounter for other preprocedural examination: Secondary | ICD-10-CM | POA: Insufficient documentation

## 2022-01-12 LAB — POTASSIUM: Potassium: 3.4 mmol/L — ABNORMAL LOW (ref 3.5–5.1)

## 2022-01-13 MED ORDER — CHLORHEXIDINE GLUCONATE CLOTH 2 % EX PADS: 6.0000 | MEDICATED_PAD | Freq: Once | CUTANEOUS | Status: DC

## 2022-01-13 MED ORDER — CHLORHEXIDINE GLUCONATE CLOTH 2 % EX PADS
6.0000 | MEDICATED_PAD | Freq: Once | CUTANEOUS | Status: AC
  Administered 2022-01-14: 6 via TOPICAL

## 2022-01-13 MED ORDER — ACETAMINOPHEN 500 MG PO TABS: 1000.0000 mg | ORAL_TABLET | ORAL | Status: AC

## 2022-01-13 MED ORDER — GABAPENTIN 300 MG PO CAPS: 300.0000 mg | ORAL_CAPSULE | ORAL | Status: AC

## 2022-01-13 MED ORDER — CHLORHEXIDINE GLUCONATE 0.12 % MT SOLN: 15.0000 mL | Freq: Once | OROMUCOSAL | Status: AC

## 2022-01-13 MED ORDER — CEFAZOLIN SODIUM-DEXTROSE 2-4 GM/100ML-% IV SOLN
2.0000 g | INTRAVENOUS | Status: AC
  Administered 2022-01-14: 2 g via INTRAVENOUS

## 2022-01-13 MED ORDER — LACTATED RINGERS IV SOLN: INTRAVENOUS | Status: DC

## 2022-01-13 MED ORDER — BUPIVACAINE LIPOSOME 1.3 % IJ SUSP: 20.0000 mL | Freq: Once | INTRAMUSCULAR | Status: DC

## 2022-01-13 MED ORDER — ORAL CARE MOUTH RINSE: 15.0000 mL | Freq: Once | OROMUCOSAL | Status: AC

## 2022-01-13 MED ORDER — FAMOTIDINE 20 MG PO TABS: 20.0000 mg | ORAL_TABLET | Freq: Once | ORAL | Status: AC

## 2022-01-13 NOTE — Anesthesia Preprocedure Evaluation (Addendum)
Anesthesia Evaluation  Patient identified by MRN, date of birth, ID band Patient awake    Reviewed: Allergy & Precautions, NPO status , Patient's Chart, lab work & pertinent test results  History of Anesthesia Complications Negative for: history of anesthetic complications  Airway Mallampati: III   Neck ROM: Full    Dental no notable dental hx.    Pulmonary  COVID 10/2021   Pulmonary exam normal breath sounds clear to auscultation       Cardiovascular Exercise Tolerance: Good negative cardio ROS Normal cardiovascular exam Rhythm:Regular Rate:Normal  ECG 01/12/22: normal   Neuro/Psych negative neurological ROS     GI/Hepatic negative GI ROS,   Endo/Other  negative endocrine ROS  Renal/GU negative Renal ROS   BPH    Musculoskeletal Gout; skin CA   Abdominal   Peds  Hematology negative hematology ROS (+)   Anesthesia Other Findings   Reproductive/Obstetrics                            Anesthesia Physical Anesthesia Plan  ASA: 2  Anesthesia Plan: General   Post-op Pain Management:    Induction: Intravenous  PONV Risk Score and Plan: 2 and Ondansetron, Dexamethasone and Treatment may vary due to age or medical condition  Airway Management Planned: Oral ETT  Additional Equipment:   Intra-op Plan:   Post-operative Plan: Extubation in OR  Informed Consent: I have reviewed the patients History and Physical, chart, labs and discussed the procedure including the risks, benefits and alternatives for the proposed anesthesia with the patient or authorized representative who has indicated his/her understanding and acceptance.     Dental advisory given  Plan Discussed with: CRNA  Anesthesia Plan Comments: (Patient consented for risks of anesthesia including but not limited to:  - adverse reactions to medications - damage to eyes, teeth, lips or other oral mucosa - nerve damage due  to positioning  - sore throat or hoarseness - damage to heart, brain, nerves, lungs, other parts of body or loss of life  Informed patient about role of CRNA in peri- and intra-operative care.  Patient voiced understanding.)       Anesthesia Quick Evaluation

## 2022-01-14 ENCOUNTER — Other Ambulatory Visit: Payer: Self-pay

## 2022-01-14 ENCOUNTER — Encounter
Admission: RE | Disposition: A | Discharge status: Home / Self Care | Payer: Self-pay | Source: Ambulatory Visit | Attending: Surgery

## 2022-01-14 ENCOUNTER — Ambulatory Visit
Admission: RE | Admit: 2022-01-14 | Discharge: 2022-01-14 | Disposition: A | Discharge status: Home / Self Care | Payer: Medicare HMO | Source: Ambulatory Visit | Attending: Surgery | Admitting: Surgery

## 2022-01-14 ENCOUNTER — Ambulatory Visit: Payer: Medicare HMO | Admitting: Urgent Care

## 2022-01-14 ENCOUNTER — Encounter: Payer: Self-pay | Admitting: Surgery

## 2022-01-14 DIAGNOSIS — N400 Enlarged prostate without lower urinary tract symptoms: Secondary | ICD-10-CM | POA: Insufficient documentation

## 2022-01-14 DIAGNOSIS — K429 Umbilical hernia without obstruction or gangrene: Secondary | ICD-10-CM | POA: Diagnosis not present

## 2022-01-14 DIAGNOSIS — E876 Hypokalemia: Secondary | ICD-10-CM

## 2022-01-14 DIAGNOSIS — K4030 Unilateral inguinal hernia, with obstruction, without gangrene, not specified as recurrent: Secondary | ICD-10-CM | POA: Diagnosis present

## 2022-01-14 DIAGNOSIS — Z01812 Encounter for preprocedural laboratory examination: Secondary | ICD-10-CM

## 2022-01-14 HISTORY — PX: UMBILICAL HERNIA REPAIR: SHX196

## 2022-01-14 HISTORY — PX: INSERTION OF MESH: SHX5868

## 2022-01-14 SURGERY — HERNIORRHAPHY, INGUINAL, ROBOT-ASSISTED, LAPAROSCOPIC
Anesthesia: General

## 2022-01-14 MED ORDER — SEVOFLURANE IN SOLN
RESPIRATORY_TRACT | Status: AC
  Filled 2022-01-14: qty 250

## 2022-01-14 MED ORDER — ONDANSETRON HCL 4 MG/2ML IJ SOLN
INTRAMUSCULAR | Status: AC
  Filled 2022-01-14: qty 2

## 2022-01-14 MED ORDER — ACETAMINOPHEN 500 MG PO TABS
ORAL_TABLET | ORAL | Status: AC
  Administered 2022-01-14: 1000 mg via ORAL
  Filled 2022-01-14: qty 2

## 2022-01-14 MED ORDER — ONDANSETRON HCL 4 MG/2ML IJ SOLN
4.0000 mg | Freq: Once | INTRAMUSCULAR | Status: AC | PRN
  Administered 2022-01-14: 4 mg via INTRAVENOUS

## 2022-01-14 MED ORDER — FENTANYL CITRATE (PF) 100 MCG/2ML IJ SOLN
INTRAMUSCULAR | Status: AC
  Filled 2022-01-14: qty 2

## 2022-01-14 MED ORDER — LIDOCAINE HCL (CARDIAC) PF 100 MG/5ML IV SOSY
PREFILLED_SYRINGE | INTRAVENOUS | Status: DC | PRN
  Administered 2022-01-14: 80 mg via INTRAVENOUS

## 2022-01-14 MED ORDER — SUGAMMADEX SODIUM 500 MG/5ML IV SOLN
INTRAVENOUS | Status: DC | PRN
  Administered 2022-01-14: 350 mg via INTRAVENOUS

## 2022-01-14 MED ORDER — OXYCODONE HCL 5 MG PO TABS: 5.0000 mg | ORAL_TABLET | ORAL | 0 refills | Status: DC | PRN

## 2022-01-14 MED ORDER — PROPOFOL 1000 MG/100ML IV EMUL
INTRAVENOUS | Status: AC
  Filled 2022-01-14: qty 100

## 2022-01-14 MED ORDER — EPHEDRINE SULFATE (PRESSORS) 50 MG/ML IJ SOLN
INTRAMUSCULAR | Status: DC | PRN
  Administered 2022-01-14 (×2): 5 mg via INTRAVENOUS

## 2022-01-14 MED ORDER — IBUPROFEN 600 MG PO TABS: 600.0000 mg | ORAL_TABLET | Freq: Three times a day (TID) | ORAL | 1 refills | Status: DC | PRN

## 2022-01-14 MED ORDER — DEXMEDETOMIDINE HCL IN NACL 200 MCG/50ML IV SOLN
INTRAVENOUS | Status: DC | PRN
  Administered 2022-01-14: 8 ug via INTRAVENOUS

## 2022-01-14 MED ORDER — ROCURONIUM BROMIDE 100 MG/10ML IV SOLN
INTRAVENOUS | Status: DC | PRN
  Administered 2022-01-14: 60 mg via INTRAVENOUS
  Administered 2022-01-14: 40 mg via INTRAVENOUS

## 2022-01-14 MED ORDER — BUPIVACAINE-EPINEPHRINE (PF) 0.5% -1:200000 IJ SOLN
INTRAMUSCULAR | Status: DC | PRN
  Administered 2022-01-14: 30 mL

## 2022-01-14 MED ORDER — MIDAZOLAM HCL 2 MG/2ML IJ SOLN
INTRAMUSCULAR | Status: DC | PRN
  Administered 2022-01-14: 2 mg via INTRAVENOUS

## 2022-01-14 MED ORDER — FENTANYL CITRATE (PF) 100 MCG/2ML IJ SOLN
INTRAMUSCULAR | Status: DC | PRN
  Administered 2022-01-14 (×2): 25 ug via INTRAVENOUS
  Administered 2022-01-14: 50 ug via INTRAVENOUS

## 2022-01-14 MED ORDER — ONDANSETRON HCL 4 MG/2ML IJ SOLN
INTRAMUSCULAR | Status: DC | PRN
  Administered 2022-01-14: 4 mg via INTRAVENOUS

## 2022-01-14 MED ORDER — DEXAMETHASONE SODIUM PHOSPHATE 10 MG/ML IJ SOLN
INTRAMUSCULAR | Status: DC | PRN
  Administered 2022-01-14: 10 mg via INTRAVENOUS

## 2022-01-14 MED ORDER — ACETAMINOPHEN 10 MG/ML IV SOLN: 1000.0000 mg | Freq: Once | INTRAVENOUS | Status: DC | PRN

## 2022-01-14 MED ORDER — FENTANYL CITRATE (PF) 100 MCG/2ML IJ SOLN: 25.0000 ug | INTRAMUSCULAR | Status: DC | PRN

## 2022-01-14 MED ORDER — OXYCODONE HCL 5 MG PO TABS
ORAL_TABLET | ORAL | Status: AC
  Filled 2022-01-14: qty 1

## 2022-01-14 MED ORDER — OXYCODONE HCL 5 MG PO TABS
5.0000 mg | ORAL_TABLET | Freq: Once | ORAL | Status: AC | PRN
  Administered 2022-01-14: 5 mg via ORAL

## 2022-01-14 MED ORDER — CHLORHEXIDINE GLUCONATE 0.12 % MT SOLN
OROMUCOSAL | Status: AC
  Administered 2022-01-14: 15 mL via OROMUCOSAL
  Filled 2022-01-14: qty 15

## 2022-01-14 MED ORDER — 0.9 % SODIUM CHLORIDE (POUR BTL) OPTIME
TOPICAL | Status: DC | PRN
  Administered 2022-01-14: 500 mL

## 2022-01-14 MED ORDER — LACTATED RINGERS IV SOLN: INTRAVENOUS | Status: DC | PRN

## 2022-01-14 MED ORDER — CEFAZOLIN SODIUM-DEXTROSE 2-4 GM/100ML-% IV SOLN
INTRAVENOUS | Status: AC
  Filled 2022-01-14: qty 100

## 2022-01-14 MED ORDER — ACETAMINOPHEN 500 MG PO TABS: 1000.0000 mg | ORAL_TABLET | Freq: Four times a day (QID) | ORAL | Status: AC | PRN

## 2022-01-14 MED ORDER — PROPOFOL 10 MG/ML IV BOLUS
INTRAVENOUS | Status: DC | PRN
  Administered 2022-01-14: 150 mg via INTRAVENOUS

## 2022-01-14 MED ORDER — GLYCOPYRROLATE 0.2 MG/ML IJ SOLN
INTRAMUSCULAR | Status: DC | PRN
  Administered 2022-01-14: .2 mg via INTRAVENOUS

## 2022-01-14 MED ORDER — BUPIVACAINE-EPINEPHRINE (PF) 0.5% -1:200000 IJ SOLN
INTRAMUSCULAR | Status: AC
  Filled 2022-01-14: qty 30

## 2022-01-14 MED ORDER — LACTATED RINGERS IV SOLN: INTRAVENOUS | Status: DC

## 2022-01-14 MED ORDER — GABAPENTIN 300 MG PO CAPS
ORAL_CAPSULE | ORAL | Status: AC
  Administered 2022-01-14: 300 mg via ORAL
  Filled 2022-01-14: qty 1

## 2022-01-14 MED ORDER — BUPIVACAINE LIPOSOME 1.3 % IJ SUSP
INTRAMUSCULAR | Status: AC
  Filled 2022-01-14: qty 20

## 2022-01-14 MED ORDER — MIDAZOLAM HCL 2 MG/2ML IJ SOLN
INTRAMUSCULAR | Status: AC
  Filled 2022-01-14: qty 2

## 2022-01-14 MED ORDER — BUPIVACAINE LIPOSOME 1.3 % IJ SUSP
INTRAMUSCULAR | Status: DC | PRN
  Administered 2022-01-14: 20 mL

## 2022-01-14 MED ORDER — OXYCODONE HCL 5 MG/5ML PO SOLN: 5.0000 mg | Freq: Once | ORAL | Status: AC | PRN

## 2022-01-14 MED ORDER — FAMOTIDINE 20 MG PO TABS
ORAL_TABLET | ORAL | Status: AC
  Administered 2022-01-14: 20 mg via ORAL
  Filled 2022-01-14: qty 1

## 2022-01-14 SURGICAL SUPPLY — 67 items
BLADE SURG 15 STRL LF DISP TIS (BLADE) ×3 IMPLANT
BLADE SURG 15 STRL SS (BLADE) ×3
CANNULA REDUC XI 12-8 STAPL (CANNULA) ×3
CANNULA REDUCER 12-8 DVNC XI (CANNULA) ×3 IMPLANT
CHLORAPREP W/TINT 26 (MISCELLANEOUS) ×3 IMPLANT
COVER TIP SHEARS 8 DVNC (MISCELLANEOUS) ×3 IMPLANT
COVER TIP SHEARS 8MM DA VINCI (MISCELLANEOUS) ×3
COVER WAND RF STERILE (DRAPES) ×3 IMPLANT
DERMABOND ADVANCED .7 DNX12 (GAUZE/BANDAGES/DRESSINGS) ×3 IMPLANT
DRAPE ARM DVNC X/XI (DISPOSABLE) ×9 IMPLANT
DRAPE COLUMN DVNC XI (DISPOSABLE) ×3 IMPLANT
DRAPE DA VINCI XI ARM (DISPOSABLE) ×9
DRAPE DA VINCI XI COLUMN (DISPOSABLE) ×3
DRAPE LAPAROTOMY 77X122 PED (DRAPES) ×3 IMPLANT
ELECT CAUTERY BLADE TIP 2.5 (TIP) ×3
ELECT REM PT RETURN 9FT ADLT (ELECTROSURGICAL) ×3
ELECTRODE CAUTERY BLDE TIP 2.5 (TIP) ×3 IMPLANT
ELECTRODE REM PT RTRN 9FT ADLT (ELECTROSURGICAL) ×3 IMPLANT
GAUZE 4X4 16PLY ~~LOC~~+RFID DBL (SPONGE) ×3 IMPLANT
GLOVE SURG SYN 7.0 (GLOVE) ×6 IMPLANT
GLOVE SURG SYN 7.0 PF PI (GLOVE) ×6 IMPLANT
GLOVE SURG SYN 7.5  E (GLOVE) ×6
GLOVE SURG SYN 7.5 E (GLOVE) ×6 IMPLANT
GLOVE SURG SYN 7.5 PF PI (GLOVE) ×6 IMPLANT
GOWN STRL REUS W/ TWL LRG LVL3 (GOWN DISPOSABLE) ×12 IMPLANT
GOWN STRL REUS W/TWL LRG LVL3 (GOWN DISPOSABLE) ×12
IRRIGATION STRYKERFLOW (MISCELLANEOUS) ×3 IMPLANT
IRRIGATOR STRYKERFLOW (MISCELLANEOUS) ×3
IV CATH ANGIO 12GX3 LT BLUE (NEEDLE) IMPLANT
IV NS 1000ML (IV SOLUTION)
IV NS 1000ML BAXH (IV SOLUTION) IMPLANT
KIT PINK PAD W/HEAD ARE REST (MISCELLANEOUS) ×3
KIT PINK PAD W/HEAD ARM REST (MISCELLANEOUS) ×3 IMPLANT
LABEL OR SOLS (LABEL) ×3 IMPLANT
MANIFOLD NEPTUNE II (INSTRUMENTS) ×3 IMPLANT
MESH 3DMAX MID 4X6 LT LRG (Mesh General) IMPLANT
NDL INSUFFLATION 14GA 120MM (NEEDLE) ×3 IMPLANT
NEEDLE HYPO 22GX1.5 SAFETY (NEEDLE) ×3 IMPLANT
NEEDLE INSUFFLATION 14GA 120MM (NEEDLE) ×3 IMPLANT
NS IRRIG 500ML POUR BTL (IV SOLUTION) ×3 IMPLANT
OBTURATOR OPTICAL STANDARD 8MM (TROCAR) ×3
OBTURATOR OPTICAL STND 8 DVNC (TROCAR) ×3
OBTURATOR OPTICALSTD 8 DVNC (TROCAR) ×3 IMPLANT
PACK BASIN MINOR ARMC (MISCELLANEOUS) ×3 IMPLANT
PACK LAP CHOLECYSTECTOMY (MISCELLANEOUS) ×3 IMPLANT
PENCIL SMOKE EVACUATOR (MISCELLANEOUS) ×3 IMPLANT
SEAL CANN UNIV 5-8 DVNC XI (MISCELLANEOUS) ×9 IMPLANT
SEAL XI 5MM-8MM UNIVERSAL (MISCELLANEOUS) ×9
SET TUBE SMOKE EVAC HIGH FLOW (TUBING) ×3 IMPLANT
SOLUTION ELECTROLUBE (MISCELLANEOUS) ×3 IMPLANT
SPONGE T-LAP 18X18 ~~LOC~~+RFID (SPONGE) ×3 IMPLANT
STAPLER CANNULA SEAL DVNC XI (STAPLE) ×3 IMPLANT
STAPLER CANNULA SEAL XI (STAPLE) ×3
SUT ETHIBOND 0 MO6 C/R (SUTURE) ×3 IMPLANT
SUT MNCRL AB 4-0 PS2 18 (SUTURE) ×3 IMPLANT
SUT VIC AB 2-0 SH 27 (SUTURE)
SUT VIC AB 2-0 SH 27XBRD (SUTURE) ×6 IMPLANT
SUT VIC AB 3-0 SH 27 (SUTURE) ×3
SUT VIC AB 3-0 SH 27X BRD (SUTURE) ×3 IMPLANT
SUT VICRYL 0 AB UR-6 (SUTURE) ×6 IMPLANT
SUT VLOC 90 S/L VL9 GS22 (SUTURE) ×3 IMPLANT
SYR 20ML LL LF (SYRINGE) ×3 IMPLANT
SYR BULB IRRIG 60ML STRL (SYRINGE) ×3 IMPLANT
TAPE TRANSPORE STRL 2 31045 (GAUZE/BANDAGES/DRESSINGS) ×3 IMPLANT
TRAP FLUID SMOKE EVACUATOR (MISCELLANEOUS) ×3 IMPLANT
TRAY FOLEY SLVR 16FR LF STAT (SET/KITS/TRAYS/PACK) ×3 IMPLANT
WATER STERILE IRR 500ML POUR (IV SOLUTION) ×3 IMPLANT

## 2022-01-14 NOTE — Anesthesia Procedure Notes (Signed)
Date/Time: 01/14/2022 7:43 AM  Performed by: Beverely Low, CRNALaryngoscope Size: McGraph and 4 Tube size: 7.5 mm

## 2022-01-14 NOTE — Anesthesia Postprocedure Evaluation (Signed)
Anesthesia Post Note  Patient: Jeremiah Beck  Procedure(s) Performed: XI ROBOTIC ASSISTED INGUINAL HERNIA, incarcerated recurrent, possible open (Left) HERNIA REPAIR UMBILICAL ADULT, open INSERTION OF MESH  Patient location during evaluation: PACU Anesthesia Type: General Level of consciousness: awake and alert Pain management: pain level controlled Vital Signs Assessment: post-procedure vital signs reviewed and stable Respiratory status: spontaneous breathing, nonlabored ventilation, respiratory function stable and patient connected to nasal cannula oxygen Cardiovascular status: blood pressure returned to baseline and stable Postop Assessment: no apparent nausea or vomiting Anesthetic complications: no   No notable events documented.   Last Vitals:  Vitals:   01/14/22 1215 01/14/22 1239  BP: 122/62 (!) 143/75  Pulse: (!) 55 68  Resp: 12 16  Temp:  (!) 36.1 C  SpO2: 96% 96%    Last Pain:  Vitals:   01/14/22 1239  TempSrc: Temporal  PainSc: 4                  Ilene Qua

## 2022-01-14 NOTE — Interval H&P Note (Signed)
History and Physical Interval Note:  01/14/2022 7:11 AM  Jeremiah Beck  has presented today for surgery, with the diagnosis of Incarcerated recurrent left inguinal hernia  Reducible umbilical hernia less 3 cm.  The various methods of treatment have been discussed with the patient and family. After consideration of risks, benefits and other options for treatment, the patient has consented to  Procedure(s): XI ROBOTIC [place], incarcerated recurrent, possible open (Left) HERNIA REPAIR UMBILICAL ADULT, open (N/A) as a surgical intervention.  The patient's history has been reviewed, patient examined, no change in status, stable for surgery.  I have reviewed the patient's chart and labs.  Questions were answered to the patient's satisfaction.     [name]

## 2022-01-14 NOTE — Discharge Instructions (Signed)
AMBULATORY SURGERY  ?DISCHARGE INSTRUCTIONS ? ? ?The drugs that you were given will stay in your system until tomorrow so for the next 24 hours you should not: ? ?Drive an automobile ?Make any legal decisions ?Drink any alcoholic beverage ? ? ?You may resume regular meals tomorrow.  Today it is better to start with liquids and gradually work up to solid foods. ? ?You may eat anything you prefer, but it is better to start with liquids, then soup and crackers, and gradually work up to solid foods. ? ? ?Please notify your doctor immediately if you have any unusual bleeding, trouble breathing, redness and pain at the surgery site, drainage, fever, or pain not relieved by medication. ? ? ? ?Additional Instructions: ? ? ? ?Please contact your physician with any problems or Same Day Surgery at [phone], Monday through Friday 6 am to 4 pm, or [facility] [place] [facility] number at [phone].  ?

## 2022-01-14 NOTE — Op Note (Signed)
Procedure Date:  01/14/2022  Pre-operative Diagnosis:  Incarcerated left inguinal hernia; reducible umbilical hernia  Post-operative Diagnosis: Incarcerated left inguinal hernia containing sigmoid colon; reducible 1 cm umbilical hernia.  Procedure: 1.  Robotic assisted Left Inguinal Hernia Repair 2.  Creation of Left Posterior Rectus-Transversalis Fascia Advancment Flap for Coverage of Pelvic Wound (200 cm) 3.  Open umbilical hernia repair.  Surgeon:  Melvyn Neth, MD  Anesthesia:  General endotracheal  Estimated Blood Loss:  25 ml  Specimens:  None  Complications:  None  Indications for Procedure:  This is a 71 y.o. male who presents with an incarcerated left inguinal hernia.  The options of surgery versus observation were reviewed with the patient and/or family. The risks of bleeding, abscess or infection, recurrence of symptoms, potential for an open procedure, injury to surrounding structures, and chronic pain were all discussed with the patient and he was willing to proceed.  We have planned this transabdominal procedure with the creation of a left peritoneal flap based on the posterior rectus sheath and transversalis fascia in order to fully cover the mesh, creating a natural tisssue barrier for the bowel and peritoneal cavity.  Description of Procedure: The patient was correctly identified in the preoperative area and brought into the operating room.  The patient was placed supine with VTE prophylaxis in place.  Appropriate time-outs were performed.  Anesthesia was induced and the patient was intubated.  Foley catheter was placed.  Appropriate antibiotics were infused.  Now under anesthesia, I was able to reduce the patient's left inguinal hernia.  The abdomen was prepped and draped in a sterile fashion. A supraumbilical incision was made. Cautery was used to dissect along the umbilical stalk, and the stalk was detached from the underlying fascia, revealing a 1 cm hernia  defect.  The fascial edges were cleared, and a Hasson trocar was inserted.  Pneumoperitoneum was obtained with appropriate opening pressures.  A Veress needle was used to start dissecting the peritoneal flap.  Two 8-mm robotic ports were placed in the right and left lateral positions under direct visualization.  A large left Bard 3D Max Mesh, a 2-0 Vicryl, and 2-0 vlock suture were placed through the umbilical port under direct visualization.  The [employer] platform was docked onto the patient, the camera was inserted and targeted, and the instruments were placed under direct visualization.  Both inguinal regions were inspected for hernias and it was confirmed that the patient had a left inguinal hernia.  The sigmoid colon was immediately adjacent to the mouth of the indirect hernia defect and thus the sigmoid had to be separated off but dissecting along White line of Toldt.  This allowed repositioning of the sigmoid into a more anatomical location.  Then, using electocautery, the peritoneal and posterior rectus tissue flap was created.  The peritoneum on the left side was scored from the median umbilical ligament laterally towards the ASIS.  The flap was mobilized using robotic scissors and the bipolar instruments, creating a plane along the posterior rectus sheath and transversalis fascia down to the pubic tubercle medially. It was then further mobilized laterally across the inguinal canal and femoral vessels and onto the psoas muscle. The inferior epigastric vessels were identified and preserved. This created a posterior rectus and peritoneal flap measuring roughly 17 cm x 12 cm.  The large hernia sac was reduced preserving all structures.  A large left Bard 3D Max Mid mesh was placed with good overlap along all the potential hernia defects and  secured in place with 2-0 Vicryl along the medial superomedial and superolateral aspects.  Then, the peritoneal flap was advanced over the mesh and carried over to close  the defect. A running 2-0 V lock suture was used to approximate the edge of the flap onto the peritoneum. At that point, it was noted that in mobilizing the sigmoid colon, a large cut was created in the inferior portion of the peritoneal flap.  An additional 2-0 V-lock suture was brought in and this was sutured close, incorporating part of the hernia sac as well.    All needles were removed under direct visualization.  The DaVinc[employer] platform was undocked.  A 14 Fr. Angiocath was inserted in the left groin and the scrotum and groin were desufflated through the catheter.  The 8- mm ports were removed under direct visualization and the Hasson trocar was removed.  The hernia defect was closed using 0 vicryl suture.  Local anesthetic was infused in all incisions as well as a left ilioinguinal block.  The umbilical stalk was reattached using 2-0 Vicryl and the umbilical incision was closed using 3-0 Vicryl and 4-0 monocryl.  The remaining port incisions were closed with 4-0 Monocryl.  The wounds were cleaned and sealed with DermaBond.  Foley catheter was removed and the patient was emerged from anesthesia and extubated and brought to the recovery room for further management.  The patient tolerated the procedure well and all counts were correct at the end of the case.   Melvyn Neth, MD

## 2022-01-14 NOTE — Addendum Note (Signed)
Addendum  created 01/14/22 1333 by Beverely Low, CRNA   Flowsheet accepted

## 2022-01-14 NOTE — Transfer of Care (Addendum)
Immediate Anesthesia Transfer of Care Note  Patient: Jeremiah Beck  Procedure(s) Performed: XI ROBOTIC ASSISTED INGUINAL HERNIA, incarcerated recurrent, possible open (Left) HERNIA REPAIR UMBILICAL ADULT, open  Patient Location: PACU  Anesthesia Type:General  Level of Consciousness: drowsy  Airway & Oxygen Therapy: Patient Spontanous Breathing and Patient connected to face mask oxygen  Post-op Assessment: Report given to RN and Post -op Vital signs reviewed and stable  Post vital signs: Reviewed and stable  Last Vitals:  Vitals Value Taken Time  BP    Temp    Pulse    Resp    SpO2      Last Pain:  Vitals:   01/14/22 0625  TempSrc: Temporal  PainSc: 0-No pain         Complications: No notable events documented.

## 2022-01-15 ENCOUNTER — Encounter: Payer: Self-pay | Admitting: Surgery

## 2022-01-28 ENCOUNTER — Ambulatory Visit (INDEPENDENT_AMBULATORY_CARE_PROVIDER_SITE_OTHER): Payer: Medicare HMO | Admitting: Physician Assistant

## 2022-01-28 DIAGNOSIS — K4030 Unilateral inguinal hernia, with obstruction, without gangrene, not specified as recurrent: Secondary | ICD-10-CM

## 2022-01-28 DIAGNOSIS — K429 Umbilical hernia without obstruction or gangrene: Secondary | ICD-10-CM

## 2022-01-28 DIAGNOSIS — Z09 Encounter for follow-up examination after completed treatment for conditions other than malignant neoplasm: Secondary | ICD-10-CM

## 2022-01-28 NOTE — Progress Notes (Deleted)
[facility] POST-OP OFFICE VISIT  01/28/2022  HPI: Jeremiah Beck is a 71 y.o. male 14 days s/p robotic assisted laparoscopic left inguinal hernia repair and open umbilical hernia repair with Dr Hampton Abbot.  ***  Vital signs: There were no vitals taken for this visit.   Physical Exam: Constitutional: Well appearing male, NAD Abdomen: ***Soft, ***non-tender, non-distended, no rebound/guarding Skin: ***Laparoscopic incisions are healing well, ***no erythema or drainage   Assessment/Plan: This is a 71 y.o. male 14 days s/p robotic assisted laparoscopic left inguinal hernia repair and open umbilical hernia repair with Dr Hampton Abbot.   - ***Pain control prn  - ***Reviewed wound care recommendation  - Reviewed lifting restrictions; 6 weeks total  - *** can follow up on as needed basis; *** understands to call with questions/concerns  -- Edison Simon, PA-C [facility] 01/28/2022, 10:59 AM M-F: 7am - 4pm

## 2022-01-29 NOTE — Progress Notes (Signed)
Error. No Show 

## 2022-02-09 ENCOUNTER — Encounter: Payer: Self-pay | Admitting: Internal Medicine

## 2022-02-09 ENCOUNTER — Ambulatory Visit (INDEPENDENT_AMBULATORY_CARE_PROVIDER_SITE_OTHER): Payer: Medicare HMO | Admitting: Internal Medicine

## 2022-02-09 VITALS — BP 128/62 | HR 77 | Temp 98.2°F | Resp 18 | Ht 71.0 in | Wt 192.1 lb

## 2022-02-09 DIAGNOSIS — N5089 Other specified disorders of the male genital organs: Secondary | ICD-10-CM | POA: Diagnosis not present

## 2022-02-09 NOTE — Patient Instructions (Signed)
It was great seeing you today!  Plan discussed at today's visit: -Surgery appointment moved up to 10/12 at 1:45 pm   Follow up in: as needed  Take care and let us know if you have any questions or concerns prior to your next visit.  Dr. Rosana Berger

## 2022-02-09 NOTE — Progress Notes (Signed)
   Acute Office Visit  Subjective:     Patient ID: Jeremiah Beck, male    DOB: 08/21/1950, 71 y.o.   MRN: [id number]  Chief Complaint  Patient presents with   Groin Swelling    HPI Patient is in today for hernia problem. Recently underwent robotic assisted left inguinal hernia repair and left open umbilical hernia repair on 01/14/22. He did not show to his post-op visit with this surgeon because of an eye issue. Today he states that he has noticed a left mass in his left testicle. First noticed a few weeks ago, was larger initially but is reducing in size. He denies pain, denies urinary symptoms. Appetite good, BM back to normal. Denies fevers or any other issues today.  Review of Systems  Constitutional:  Negative for chills and fever.  Gastrointestinal:  Negative for abdominal pain, constipation, diarrhea, nausea and vomiting.  Genitourinary:  Negative for dysuria, frequency, hematuria and urgency.        Objective:    BP 128/62   Pulse 77   Temp 98.2 F (36.8 C)   Resp 18   Ht '5\' 11"'$  (1.803 m)   Wt 192 lb 1.6 oz (87.1 kg)   SpO2 96%   BMI 26.79 kg/m  BP Readings from Last 3 Encounters:  02/09/22 128/62  01/14/22 132/73  01/08/22 133/78   Wt Readings from Last 3 Encounters:  02/09/22 192 lb 1.6 oz (87.1 kg)  01/14/22 200 lb (90.7 kg)  01/11/22 205 lb (93 kg)      Physical Exam Exam conducted with a chaperone present.  Constitutional:      Appearance: Normal appearance.  HENT:     Head: Normocephalic and atraumatic.  Eyes:     Conjunctiva/sclera: Conjunctivae normal.  Cardiovascular:     Rate and Rhythm: Normal rate and regular rhythm.  Pulmonary:     Effort: Pulmonary effort is normal.     Breath sounds: Normal breath sounds.  Genitourinary:    Penis: Normal.      Comments: Swollen left testicle, no mass palpated, no pain Skin:    General: Skin is warm and dry.     Comments: Incisions healing well   Neurological:     General: No focal deficit  present.     Mental Status: He is alert. Mental status is at baseline.  Psychiatric:        Mood and Affect: Mood normal.        Behavior: Behavior normal.     No results found for any visits on 02/09/22.      Assessment & Plan:   1. Testicular swelling, left: Needs to be evaluated by surgery for post-op visit, appointment moved up to 10/12. No tenderness or other changes other than swelling.   Return if symptoms worsen or fail to improve.  Teodora Medici, DO

## 2022-02-11 ENCOUNTER — Other Ambulatory Visit: Payer: Self-pay

## 2022-02-11 ENCOUNTER — Ambulatory Visit (INDEPENDENT_AMBULATORY_CARE_PROVIDER_SITE_OTHER): Payer: Medicare HMO | Admitting: Physician Assistant

## 2022-02-11 ENCOUNTER — Encounter: Payer: Self-pay | Admitting: Physician Assistant

## 2022-02-11 VITALS — BP 135/83 | HR 60 | Temp 98.0°F | Ht 71.0 in | Wt 191.0 lb

## 2022-02-11 DIAGNOSIS — K429 Umbilical hernia without obstruction or gangrene: Secondary | ICD-10-CM

## 2022-02-11 DIAGNOSIS — K4030 Unilateral inguinal hernia, with obstruction, without gangrene, not specified as recurrent: Secondary | ICD-10-CM

## 2022-02-11 DIAGNOSIS — Z09 Encounter for follow-up examination after completed treatment for conditions other than malignant neoplasm: Secondary | ICD-10-CM

## 2022-02-11 NOTE — Progress Notes (Signed)
[facility] POST-OP OFFICE VISIT  02/11/2022  HPI: Jeremiah Beck is a 71 y.o. male ~1 month s/p robotic assisted laparoscopic left inguinal hernia repair and open umbilical hernia repair with Dr Hampton Abbot.  Overall seems to be doing well He has noticed swelling and firmness in his medial left groin and into scrotum some; this is not tender, seems to be getting smaller No fever, chills, nausea, emesis No issues with urination or bowel movements Incisions healed well Ambulating without issues  Vital signs: BP 135/83   Pulse 60   Temp 98 F (36.7 C) (Oral)   Ht '5\' 11"'$  (1.803 m)   Wt 191 lb (86.6 kg)   SpO2 97%   BMI 26.64 kg/m    Physical Exam: Constitutional: Well appearing male, NAD Abdomen: Soft, non-tender, non-distended, no rebound/guarding GU: To the left groin and into the scrotum there is an area of induration, this is non-tender, no fluctuance. This does not appear to be recurrence Skin: Laparoscopic incisions are healing well, no erythema or drainage   Assessment/Plan: This is a 71 y.o. male  ~1 month s/p robotic assisted laparoscopic left inguinal hernia repair and open umbilical hernia repair with Dr Hampton Abbot.   - Suspect area in left groin/scrotum is expected scarring and induration given hernia repair and previous large hernia in this area. There is no evidence of abscess or fluid collection. This does not appear consistent with recurrence. I did offer [place] for peace of mind but patient declined.    - Pain control prn  - Reviewed wound care recommendation  - Reviewed lifting restrictions; 6 weeks total  - He can follow up on as needed basis; He understands to call with questions/concerns  -- Edison Simon, PA-C [facility] 02/11/2022, 3:13 PM M-F: 7am - 4pm

## 2022-02-11 NOTE — Patient Instructions (Signed)

## 2022-02-16 ENCOUNTER — Encounter: Payer: Medicare HMO | Admitting: Physician Assistant

## 2022-08-27 ENCOUNTER — Other Ambulatory Visit: Payer: Self-pay | Admitting: Internal Medicine

## 2022-08-27 DIAGNOSIS — N401 Enlarged prostate with lower urinary tract symptoms: Secondary | ICD-10-CM

## 2022-08-27 DIAGNOSIS — Z5181 Encounter for therapeutic drug level monitoring: Secondary | ICD-10-CM

## 2022-08-27 NOTE — Telephone Encounter (Signed)
Requested Prescriptions  Pending Prescriptions Disp Refills   finasteride (PROSCAR) 5 MG tablet [Pharmacy Med Name: FINASTERIDE 5 MG TAB] 90 tablet 0    Sig: TAKE 1 TABLET BY MOUTH ONCE DAILY     Urology: 5-alpha Reductase Inhibitors Failed - 08/27/2022 10:02 AM      Failed - PSA in normal range and within 360 days    PSA  Date Value Ref Range Status  08/14/2020 0.30 < OR = 4.0 ng/mL Final    Comment:    The total PSA value from this assay system is  standardized against the WHO standard. The test  result will be approximately 20% lower when compared  to the equimolar-standardized total PSA (Beckman  Coulter). Comparison of serial PSA results should be  interpreted with this fact in mind. . This test was performed using the Siemens  chemiluminescent method. Values obtained from  different assay methods cannot be used interchangeably. PSA levels, regardless of value, should not be interpreted as absolute evidence of the presence or absence of disease.    Prostate Specific Ag, Serum  Date Value Ref Range Status  01/29/2019 0.2 0.0 - 4.0 ng/mL Final    Comment:    Roche ECLIA methodology. According to the American Urological Association, Serum PSA should decrease and remain at undetectable levels after radical prostatectomy. The AUA defines biochemical recurrence as an initial PSA value 0.2 ng/mL or greater followed by a subsequent confirmatory PSA value 0.2 ng/mL or greater. Values obtained with different assay methods or kits cannot be used interchangeably. Results cannot be interpreted as absolute evidence of the presence or absence of malignant disease.          Passed - Valid encounter within last 12 months    Recent Outpatient Visits           6 months ago Testicular swelling, left   [facility] Margarita Mail, DO   1 year ago Annual physical exam   [facility] Margarita Mail, DO   2 years ago Pure  hypercholesterolemia   [facility] Danelle Berry, PA-C   2 years ago Pure hypercholesterolemia   [facility] Danelle Berry, PA-C   3 years ago Pure hypercholesterolemia   [facility] Danelle Berry, [place]

## 2022-08-30 ENCOUNTER — Telehealth: Payer: Self-pay | Admitting: Internal Medicine

## 2022-08-30 NOTE — Telephone Encounter (Signed)
Contacted Jeremiah Beck to schedule their annual wellness visit. Appointment made for 09/24/2022.  [facility] Care [facility] AWV TEAM Direct Dial: [phone]

## 2022-09-24 ENCOUNTER — Ambulatory Visit (INDEPENDENT_AMBULATORY_CARE_PROVIDER_SITE_OTHER): Payer: Medicare HMO

## 2022-09-24 VITALS — Wt 191.0 lb

## 2022-09-24 DIAGNOSIS — Z0000 Encounter for general adult medical examination without abnormal findings: Secondary | ICD-10-CM

## 2022-09-24 NOTE — Patient Instructions (Signed)
Jeremiah Beck , Thank you for taking time to come for your Medicare Wellness Visit. I appreciate your ongoing commitment to your health goals. Please review the following plan we discussed and let me know if I can assist you in the future.   These are the goals we discussed:  Goals   None     This is a list of the screening recommended for you and due dates:  Health Maintenance  Topic Date Due   Zoster (Shingles) Vaccine (1 of 2) Never done   Flu Shot  12/02/2022   Medicare Annual Wellness Visit  09/24/2023   Cologuard (Stool DNA test)  07/15/2024   DTaP/Tdap/Td vaccine (4 - Td or Tdap) 04/29/2031   Pneumonia Vaccine  Completed   Hepatitis C Screening  Completed   HPV Vaccine  Aged Out   COVID-19 Vaccine  Discontinued    Advanced directives: Please bring a copy of your health care power of attorney and living will to the office to be added to your chart at your convenience.   Conditions/risks identified: Aim for 30 minutes of exercise or brisk walking, 6-8 glasses of water, and 5 servings of fruits and vegetables each day.   Next appointment: Follow up in one year for your annual wellness visit. 09/25/23  Preventive Care 65 Years and Older, Male  Preventive care refers to lifestyle choices and visits with your health care provider that can promote health and wellness. What does preventive care include? A yearly physical exam. This is also called an annual well check. Dental exams once or twice a year. Routine eye exams. Ask your health care provider how often you should have your eyes checked. Personal lifestyle choices, including: Daily care of your teeth and gums. Regular physical activity. Eating a healthy diet. Avoiding tobacco and drug use. Limiting alcohol use. Practicing safe sex. Taking low doses of aspirin every day. Taking vitamin and mineral supplements as recommended by your health care provider. What happens during an annual well check? The services and  screenings done by your health care provider during your annual well check will depend on your age, overall health, lifestyle risk factors, and family history of disease. Counseling  Your health care provider may ask you questions about your: Alcohol use. Tobacco use. Drug use. Emotional well-being. Home and relationship well-being. Sexual activity. Eating habits. History of falls. Memory and ability to understand (cognition). Work and work Astronomer. Screening  You may have the following tests or measurements: Height, weight, and BMI. Blood pressure. Lipid and cholesterol levels. These may be checked every 5 years, or more frequently if you are over 87 years old. Skin check. Lung cancer screening. You may have this screening every year starting at age 106 if you have a 30-pack-year history of smoking and currently smoke or have quit within the past 15 years. Fecal occult blood test (FOBT) of the stool. You may have this test every year starting at age 53. Flexible sigmoidoscopy or colonoscopy. You may have a sigmoidoscopy every 5 years or a colonoscopy every 10 years starting at age 68. Prostate cancer screening. Recommendations will vary depending on your family history and other risks. Hepatitis C blood test. Hepatitis B blood test. Sexually transmitted disease (STD) testing. Diabetes screening. This is done by checking your blood sugar (glucose) after you have not eaten for a while (fasting). You may have this done every 1-3 years. Abdominal aortic aneurysm (AAA) screening. You may need this if you are a current or former smoker.  Osteoporosis. You may be screened starting at age 72 if you are at high risk. Talk with your health care provider about your test results, treatment options, and if necessary, the need for more tests. Vaccines  Your health care provider may recommend certain vaccines, such as: Influenza vaccine. This is recommended every year. Tetanus, diphtheria, and  acellular pertussis (Tdap, Td) vaccine. You may need a Td booster every 10 years. Zoster vaccine. You may need this after age 12. Pneumococcal 13-valent conjugate (PCV13) vaccine. One dose is recommended after age 53. Pneumococcal polysaccharide (PPSV23) vaccine. One dose is recommended after age 80. Talk to your health care provider about which screenings and vaccines you need and how often you need them. This information is not intended to replace advice given to you by your health care provider. Make sure you discuss any questions you have with your health care provider. Document Released: 05/16/2015 Document Revised: 01/07/2016 Document Reviewed: 02/18/2015 Elsevier Interactive Patient Education  2017 [employer].  Fall Prevention in the Home Falls can cause injuries. They can happen to people of all ages. There are many things you can do to make your home safe and to help prevent falls. What can I do on the outside of my home? Regularly fix the edges of walkways and driveways and fix any cracks. Remove anything that might make you trip as you walk through a door, such as a raised step or threshold. Trim any bushes or trees on the path to your home. Use bright outdoor lighting. Clear any walking paths of anything that might make someone trip, such as rocks or tools. Regularly check to see if handrails are loose or broken. Make sure that both sides of any steps have handrails. Any raised decks and porches should have guardrails on the edges. Have any leaves, snow, or ice cleared regularly. Use sand or salt on walking paths during winter. Clean up any spills in your garage right away. This includes oil or grease spills. What can I do in the bathroom? Use night lights. Install grab bars by the toilet and in the tub and shower. Do not use towel bars as grab bars. Use non-skid mats or decals in the tub or shower. If you need to sit down in the shower, use a plastic, non-slip stool. Keep  the floor dry. Clean up any water that spills on the floor as soon as it happens. Remove soap buildup in the tub or shower regularly. Attach bath mats securely with double-sided non-slip rug tape. Do not have throw rugs and other things on the floor that can make you trip. What can I do in the bedroom? Use night lights. Make sure that you have a light by your bed that is easy to reach. Do not use any sheets or blankets that are too big for your bed. They should not hang down onto the floor. Have a firm chair that has side arms. You can use this for support while you get dressed. Do not have throw rugs and other things on the floor that can make you trip. What can I do in the kitchen? Clean up any spills right away. Avoid walking on wet floors. Keep items that you use a lot in easy-to-reach places. If you need to reach something above you, use a strong step stool that has a grab bar. Keep electrical cords out of the way. Do not use floor polish or wax that makes floors slippery. If you must use wax, use non-skid floor  wax. Do not have throw rugs and other things on the floor that can make you trip. What can I do with my stairs? Do not leave any items on the stairs. Make sure that there are handrails on both sides of the stairs and use them. Fix handrails that are broken or loose. Make sure that handrails are as long as the stairways. Check any carpeting to make sure that it is firmly attached to the stairs. Fix any carpet that is loose or worn. Avoid having throw rugs at the top or bottom of the stairs. If you do have throw rugs, attach them to the floor with carpet tape. Make sure that you have a light switch at the top of the stairs and the bottom of the stairs. If you do not have them, ask someone to add them for you. What else can I do to help prevent falls? Wear shoes that: Do not have high heels. Have rubber bottoms. Are comfortable and fit you well. Are closed at the toe. Do not  wear sandals. If you use a stepladder: Make sure that it is fully opened. Do not climb a closed stepladder. Make sure that both sides of the stepladder are locked into place. Ask someone to hold it for you, if possible. Clearly mark and make sure that you can see: Any grab bars or handrails. First and last steps. Where the edge of each step is. Use tools that help you move around (mobility aids) if they are needed. These include: Canes. Walkers. Scooters. Crutches. Turn on the lights when you go into a dark area. Replace any light bulbs as soon as they burn out. Set up your furniture so you have a clear path. Avoid moving your furniture around. If any of your floors are uneven, fix them. If there are any pets around you, be aware of where they are. Review your medicines with your doctor. Some medicines can make you feel dizzy. This can increase your chance of falling. Ask your doctor what other things that you can do to help prevent falls. This information is not intended to replace advice given to you by your health care provider. Make sure you discuss any questions you have with your health care provider. Document Released: 02/13/2009 Document Revised: 09/25/2015 Document Reviewed: 05/24/2014 Elsevier Interactive Patient Education  2017 [employer].

## 2022-09-24 NOTE — Progress Notes (Signed)
Subjective:   Jeremiah Beck is a 72 y.o. male who presents for Medicare Annual/Subsequent preventive examination.  Review of Systems    I connected with  Jeremiah Beck on 09/24/22 by a audio enabled telemedicine application and verified that I am speaking with the correct person using two identifiers.  Patient Location: Home  Provider Location: Home Office  I discussed the limitations of evaluation and management by telemedicine. The patient expressed understanding and agreed to proceed.  Cardiac Risk Factors include: advanced age (>32men, >61 women)     Objective:    Today's Vitals   09/24/22 0940  Weight: 191 lb (86.6 kg)   Body mass index is 26.64 kg/m.     09/24/2022    9:49 AM 01/11/2022    3:45 PM 01/05/2022   12:26 PM 10/15/2021    2:55 PM 09/09/2020    1:58 PM 02/22/2017    3:33 PM 06/23/2016   11:15 AM  Advanced Directives  Does Patient Have a Medical Advance Directive? Yes No No No No No No  Type of Estate agent of [place];Living will        Copy of Healthcare Power of Attorney in Chart? No - copy requested        Would patient like information on creating a medical advance directive?  No - Patient declined No - Patient declined No - Patient declined No - Patient declined      Current Medications (verified) Outpatient Encounter Medications as of 09/24/2022  Medication Sig   acetaminophen (TYLENOL) 500 MG tablet Take 2 tablets (1,000 mg total) by mouth every 6 (six) hours as needed for mild pain.   Apple Cider Vinegar 300 MG TABS Take by mouth.   finasteride (PROSCAR) 5 MG tablet TAKE 1 TABLET BY MOUTH ONCE DAILY   Garlic 100 MG TABS Take by mouth.   Multiple Vitamins-Minerals (ONE-A-DAY MENS 50+ ADVANTAGE) TABS Take by mouth.   Omega-3 Fatty Acids (FISH OIL) 1000 MG CAPS Take by mouth.   Turmeric 500 MG CAPS Take by mouth.   [DISCONTINUED] ibuprofen (ADVIL) 600 MG tablet Take 1 tablet (600 mg total) by mouth every 8 (eight) hours as  needed for moderate pain.   [DISCONTINUED] oxyCODONE (OXY IR/ROXICODONE) 5 MG immediate release tablet Take 1 tablet (5 mg total) by mouth every 4 (four) hours as needed for severe pain.   [DISCONTINUED] vitamin B-12 (CYANOCOBALAMIN) 1000 MCG tablet Take 1,000 mcg by mouth daily.   No facility-administered encounter medications on file as of 09/24/2022.    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   xrays Oct 2018   ED (erectile dysfunction)    Past Surgical History:  Procedure Laterality Date   HERNIA REPAIR Left    inguinal   INSERTION OF MESH  01/14/2022   Procedure: INSERTION OF MESH;  Surgeon: Henrene Dodge, MD;  Location: [facility] ORS;  Service: General;;   SKIN CANCER EXCISION     removed from face   UMBILICAL HERNIA REPAIR N/A 01/14/2022   Procedure: HERNIA REPAIR UMBILICAL ADULT, open;  Surgeon: Henrene Dodge, MD;  Location: [facility] ORS;  Service: General;  Laterality: N/A;   Family History  Problem Relation Age of Onset   Cancer Father    Alcohol abuse Father    Cancer Mother  Varicose Veins Paternal Grandmother    Social History   Socioeconomic History   Marital status: Divorced    Spouse name: Not on file   Number of children: 0   Years of education: Not on file   Highest education level: Some college, no degree  Occupational History   Occupation: retired  Tobacco Use   Smoking status: Never   Smokeless tobacco: Never  Vaping Use   Vaping Use: Never used  Substance and Sexual Activity   Alcohol use: No   Drug use: No   Sexual activity: Not Currently  Other Topics Concern   Not on file  Social History Narrative   Not on file   Social Determinants of Health   Financial Resource Strain: Low Risk  (09/24/2022)   Overall Financial Resource Strain (CARDIA)     Difficulty of Paying Living Expenses: Not hard at all  Food Insecurity: No Food Insecurity (09/24/2022)   Hunger Vital Sign    Worried About Running Out of Food in the Last Year: Never true    Ran Out of Food in the Last Year: Never true  Transportation Needs: No Transportation Needs (10/15/2021)   PRAPARE - Administrator, Civil Service (Medical): No    Lack of Transportation (Non-Medical): No  Physical Activity: Sufficiently Active (09/24/2022)   Exercise Vital Sign    Days of Exercise per Week: 7 days    Minutes of Exercise per Session: 30 min  Stress: No Stress Concern Present (09/24/2022)   [employer] of Occupational Health - Occupational Stress Questionnaire    Feeling of Stress : Not at all  Social Connections: Socially Integrated (09/24/2022)   Social Connection and Isolation Panel [NHANES]    Frequency of Communication with Friends and Family: More than three times a week    Frequency of Social Gatherings with Friends and Family: More than three times a week    Attends Religious Services: More than 4 times per year    Active Member of [employer] or Organizations: Yes    Attends Engineer, structural: More than 4 times per year    Marital Status: Married    Tobacco Counseling Counseling given: Yes   Clinical Intake:  Pre-visit preparation completed: Yes  Pain : No/denies pain     BMI - recorded: 26.64 Nutritional Status: BMI 25 -29 Overweight Nutritional Risks: None Diabetes: No  How often do you need to have someone help you when you read instructions, pamphlets, or other written materials from your doctor or pharmacy?: 1 - Never  Diabetic?NO  Interpreter Needed?: No  Information entered by :: [name]   Activities of Daily Living    09/24/2022    9:51 AM 02/09/2022    1:05 PM  In your present state of health, do you have any difficulty performing the following activities:  Hearing? 0 0  Vision? 0 0  Difficulty  concentrating or making decisions? 0 0  Walking or climbing stairs? 0 0  Dressing or bathing? 0 0  Doing errands, shopping? 0 0  Preparing Food and eating ? N   Using the Toilet? N   In the past six months, have you accidently leaked urine? N   Do you have problems with loss of bowel control? N   Managing your Medications? N   Managing your Finances? N   Housekeeping or managing your Housekeeping? N     Patient Care Team: Margarita Mail, DO as PCP - General (Internal Medicine)  Indicate any  recent Medical Services you may have received from other than [facility] providers in the past year (date may be approximate).     Assessment:   This is a routine wellness examination for Kanari.  Hearing/Vision screen Hearing Screening - Comments:: Denies hearing difficulties   Vision Screening - Comments:: Wears rx glasses - up to date with routine eye exams with  [facility]  Dietary issues and exercise activities discussed: Current Exercise Habits: Home exercise routine, Type of exercise: walking (yard work), Time (Minutes): 40, Frequency (Times/Week): 3, Weekly Exercise (Minutes/Week): 120   Goals Addressed   None   Depression Screen    09/24/2022    9:45 AM 02/09/2022    1:05 PM 02/09/2022    1:02 PM 10/15/2021    2:54 PM 04/28/2021    2:30 PM 09/09/2020    2:02 PM 09/09/2020    1:55 PM  PHQ 2/9 Scores  PHQ - 2 Score 0 0 0 0 0 0 0  PHQ- 9 Score  0 0  0      Fall Risk    09/24/2022    9:51 AM 02/11/2022    2:13 PM 02/09/2022    1:05 PM 02/09/2022    1:02 PM 01/08/2022   10:15 AM  Fall Risk   Falls in the past year? 0 0 0 0 0  Number falls in past yr: 0  0 0   Injury with Fall? 0  0 0   Risk for fall due to : No Fall Risks   No Fall Risks   Follow up Falls prevention discussed;Falls evaluation completed   Falls prevention discussed;Education provided     FALL RISK PREVENTION PERTAINING TO THE HOME:  Any stairs in or around the home? No  If so, are there any  without handrails? No  Home free of loose throw rugs in walkways, pet beds, electrical cords, etc? No  Adequate lighting in your home to reduce risk of falls? Yes   ASSISTIVE DEVICES UTILIZED TO PREVENT FALLS:  Life alert? No  Use of a cane, walker or w/c? No  Grab bars in the bathroom? Yes  Shower chair or bench in shower? No  Elevated toilet seat or a handicapped toilet? No   TIMED UP AND GO:  Was the test performed?  No Televisit  .   Cognitive Function:        09/24/2022    9:46 AM  6CIT Screen  What Year? 0 points  What month? 0 points  What time? 0 points  Count back from 20 0 points  Months in reverse 0 points  Repeat phrase 0 points  Total Score 0 points    Immunizations Immunization History  Administered Date(s) Administered   Influenza, High Dose Seasonal PF 02/22/2017   Influenza,inj,Quad PF,6+ Mos 02/01/2019   Pneumococcal Conjugate-13 02/22/2017   Pneumococcal Polysaccharide-23 05/01/2019   Td 01/01/2002, 05/03/2010   Tdap 04/28/2021    TDAP status: Up to date  Flu Vaccine status: Up to date  Pneumococcal vaccine status: Up to date  Covid-19 vaccine status: Information provided on how to obtain vaccines.   Qualifies for Shingles Vaccine? Yes   Zostavax completed No   Shingrix Completed?: No.    Education has been provided regarding the importance of this vaccine. Patient has been advised to call insurance company to determine out of pocket expense if they have not yet received this vaccine. Advised may also receive vaccine at local pharmacy or Health Dept. Verbalized acceptance and understanding.  Screening Tests Health Maintenance  Topic Date Due   Zoster Vaccines- Shingrix (1 of 2) Never done   INFLUENZA VACCINE  12/02/2022   Medicare Annual Wellness (AWV)  09/24/2023   Fecal DNA (Cologuard)  07/15/2024   DTaP/Tdap/Td (4 - Td or Tdap) 04/29/2031   Pneumonia Vaccine 16+ Years old  Completed   Hepatitis C Screening  Completed   HPV VACCINES   Aged Out   COVID-19 Vaccine  Discontinued    Health Maintenance  Health Maintenance Due  Topic Date Due   Zoster Vaccines- Shingrix (1 of 2) Never done    Colorectal cancer screening: Type of screening: Colonoscopy. Completed 07/15/21. Repeat every 3 years  Lung Cancer Screening: (Low Dose CT Chest recommended if Age 76-80 years, 30 pack-year currently smoking OR have quit w/in 15years.) does not qualify.   Lung Cancer Screening Referral: no  Additional Screening:  Hepatitis C Screening: does qualify; Completed 02/22/2017  Vision Screening: Recommended annual ophthalmology exams for early detection of glaucoma and other disorders of the eye. Is the patient up to date with their annual eye exam?  Yes  Who is the provider or what is the name of the office in which the patient attends annual eye exams? [facility] If pt is not established with a provider, would they like to be referred to a provider to establish care? No .   Dental Screening: Recommended annual dental exams for proper oral hygiene  Community Resource Referral / Chronic Care Management: CRR required this visit?  No   CCM required this visit?  No      Plan:     I have personally reviewed and noted the following in the patient's chart:   Medical and social history Use of alcohol, tobacco or illicit drugs  Current medications and supplements including opioid prescriptions. Patient is not currently taking opioid prescriptions. Functional ability and status Nutritional status Physical activity Advanced directives List of other physicians Hospitalizations, surgeries, and ER visits in previous 12 months Vitals Screenings to include cognitive, depression, and falls Referrals and appointments  In addition, I have reviewed and discussed with patient certain preventive protocols, quality metrics, and best practice recommendations. A written personalized care plan for preventive services as well as  general preventive health recommendations were provided to patient.     Annabell Sabal, CMA   09/24/2022   Nurse Notes: At his next appointment he would discuss getting a referral regarding sharp and feeling like needle  in feet that going awhile. He said that he is using Tylenol for the pain.

## 2022-09-28 ENCOUNTER — Other Ambulatory Visit: Payer: Self-pay | Admitting: Internal Medicine

## 2022-09-28 ENCOUNTER — Telehealth: Payer: Self-pay | Admitting: Internal Medicine

## 2022-09-28 DIAGNOSIS — M21619 Bunion of unspecified foot: Secondary | ICD-10-CM

## 2022-09-28 NOTE — Telephone Encounter (Signed)
Bilateral foot pain, a corn

## 2022-09-28 NOTE — Telephone Encounter (Signed)
Pt is calling to ask for referral to podiatry. Pt reports that he has discuss his L & R Feet concerns with Dr. Caralee Ates. Please advise CB- [phone]

## 2022-09-28 NOTE — Telephone Encounter (Signed)
Thinks he may have neuropathy

## 2022-11-27 ENCOUNTER — Other Ambulatory Visit: Payer: Self-pay | Admitting: Internal Medicine

## 2022-11-27 DIAGNOSIS — Z5181 Encounter for therapeutic drug level monitoring: Secondary | ICD-10-CM

## 2022-11-27 DIAGNOSIS — N401 Enlarged prostate with lower urinary tract symptoms: Secondary | ICD-10-CM

## 2022-11-29 NOTE — Telephone Encounter (Signed)
Requested Prescriptions  Pending Prescriptions Disp Refills   finasteride (PROSCAR) 5 MG tablet [Pharmacy Med Name: FINASTERIDE 5 MG TAB] 90 tablet 0    Sig: TAKE 1 TABLET BY MOUTH ONCE DAILY     Urology: 5-alpha Reductase Inhibitors Failed - 11/27/2022 10:29 AM      Failed - PSA in normal range and within 360 days    PSA  Date Value Ref Range Status  08/14/2020 0.30 < OR = 4.0 ng/mL Final    Comment:    The total PSA value from this assay system is  standardized against the WHO standard. The test  result will be approximately 20% lower when compared  to the equimolar-standardized total PSA (Beckman  Coulter). Comparison of serial PSA results should be  interpreted with this fact in mind. . This test was performed using the Siemens  chemiluminescent method. Values obtained from  different assay methods cannot be used interchangeably. PSA levels, regardless of value, should not be interpreted as absolute evidence of the presence or absence of disease.    Prostate Specific Ag, Serum  Date Value Ref Range Status  01/29/2019 0.2 0.0 - 4.0 ng/mL Final    Comment:    Roche ECLIA methodology. According to the American Urological Association, Serum PSA should decrease and remain at undetectable levels after radical prostatectomy. The AUA defines biochemical recurrence as an initial PSA value 0.2 ng/mL or greater followed by a subsequent confirmatory PSA value 0.2 ng/mL or greater. Values obtained with different assay methods or kits cannot be used interchangeably. Results cannot be interpreted as absolute evidence of the presence or absence of malignant disease.          Passed - Valid encounter within last 12 months    Recent Outpatient Visits           9 months ago Testicular swelling, left   [facility] Margarita Mail, DO   1 year ago Annual physical exam   [facility] Margarita Mail, DO   2 years ago Pure  hypercholesterolemia   [facility] Danelle Berry, PA-C   2 years ago Pure hypercholesterolemia   [facility] Danelle Berry, PA-C   3 years ago Pure hypercholesterolemia   [facility] Danelle Berry, [place]

## 2022-12-08 NOTE — Telephone Encounter (Signed)
Copied from CRM [id number]. Topic: Referral - Request for Referral >> Dec 08, 2022 11:40 AM Dondra Prader A wrote: Reason for CRM: Dermatologists   Has patient seen PCP for this complaint? Yes.   *If NO, is insurance requiring patient see PCP for this issue before PCP can refer them? Referral for which specialty: Dermatology  Preferred provider/office: Pt states that he has been going to [place] for years for his Dermatologists and is wanting to change Dermatologists. Pt states that he still want to stay in the same area and does not want to go to [place].  Reason for referral: Wanting a new Dermatologists

## 2022-12-14 ENCOUNTER — Telehealth: Payer: Self-pay | Admitting: Internal Medicine

## 2022-12-14 NOTE — Telephone Encounter (Signed)
Copied from CRM [id number]. Topic: Referral - Status >> Dec 14, 2022  3:58 PM Lennox Pippins wrote: Patient has called to check on the status of the dermatologist referral. He was wondering who PCP would recommend. Please advise & contact back @ [phone].   CRM was sent on 12/08/2022 about this referral and was converted to a telephone encounter but in a medication refill encounter, please advise.

## 2022-12-14 NOTE — Telephone Encounter (Signed)
Talked with pt he may just stay where he is at. Will call back if changes mind

## 2023-02-28 ENCOUNTER — Other Ambulatory Visit: Payer: Self-pay | Admitting: Internal Medicine

## 2023-02-28 DIAGNOSIS — Z5181 Encounter for therapeutic drug level monitoring: Secondary | ICD-10-CM

## 2023-02-28 DIAGNOSIS — N401 Enlarged prostate with lower urinary tract symptoms: Secondary | ICD-10-CM

## 2023-03-01 NOTE — Telephone Encounter (Signed)
Requested medication (s) are due for refill today:   Yes  Requested medication (s) are on the active medication list:   Yes  Future visit scheduled:   No   Last ordered: 11/29/2022 #90, 0 refills  Unable to refill because PSA is due per protocol.     Requested Prescriptions  Pending Prescriptions Disp Refills   finasteride (PROSCAR) 5 MG tablet [Pharmacy Med Name: FINASTERIDE 5 MG TAB] 90 tablet 0    Sig: TAKE 1 TABLET BY MOUTH ONCE DAILY     Urology: 5-alpha Reductase Inhibitors Failed - 02/28/2023  9:49 AM      Failed - PSA in normal range and within 360 days    PSA  Date Value Ref Range Status  08/14/2020 0.30 < OR = 4.0 ng/mL Final    Comment:    The total PSA value from this assay system is  standardized against the WHO standard. The test  result will be approximately 20% lower when compared  to the equimolar-standardized total PSA (Beckman  Coulter). Comparison of serial PSA results should be  interpreted with this fact in mind. . This test was performed using the Siemens  chemiluminescent method. Values obtained from  different assay methods cannot be used interchangeably. PSA levels, regardless of value, should not be interpreted as absolute evidence of the presence or absence of disease.    Prostate Specific Ag, Serum  Date Value Ref Range Status  01/29/2019 0.2 0.0 - 4.0 ng/mL Final    Comment:    Roche ECLIA methodology. According to the American Urological Association, Serum PSA should decrease and remain at undetectable levels after radical prostatectomy. The AUA defines biochemical recurrence as an initial PSA value 0.2 ng/mL or greater followed by a subsequent confirmatory PSA value 0.2 ng/mL or greater. Values obtained with different assay methods or kits cannot be used interchangeably. Results cannot be interpreted as absolute evidence of the presence or absence of malignant disease.          Passed - Valid encounter within last 12 months     Recent Outpatient Visits           1 year ago Testicular swelling, left   [facility] Margarita Mail, DO   1 year ago Annual physical exam   [facility] Margarita Mail, DO   2 years ago Pure hypercholesterolemia   [facility] Danelle Berry, PA-C   2 years ago Pure hypercholesterolemia   [facility] Danelle Berry, PA-C   3 years ago Pure hypercholesterolemia   [facility] Danelle Berry, [place]

## 2023-03-01 NOTE — Telephone Encounter (Signed)
Copied from CRM [id number]. Topic: Referral - Request for Referral >> Mar 01, 2023 11:33 AM Benetta Spar B wrote: Reason for CRM: pt requests a different referral then Dr Cheree Ditto at [facility]. He is not happy at this one  Has patient seen PCP for this complaint? yes *If NO, is insurance requiring patient see PCP for this issue before PCP can refer them? Referral for which specialty: dermatologist Preferred provider/office: ? Reason for referral: skin issue

## 2023-03-09 NOTE — Telephone Encounter (Signed)
Copied from CRM [id number]. Topic: Appointment Scheduling - Scheduling Inquiry for Clinic >> Mar 08, 2023  4:27 PM Phill Myron wrote: Pt Jeremiah Beck would like to know if he needs any updated blood work. Please advise

## 2023-03-09 NOTE — Telephone Encounter (Signed)
Copied from CRM [id number]. Topic: Referral - Status >> Mar 08, 2023  4:17 PM Phill Myron wrote: What is the status of pt. Jeremiah Beck's referral for a new dermatologist.

## 2023-03-10 ENCOUNTER — Telehealth: Payer: Self-pay | Admitting: Internal Medicine

## 2023-03-10 NOTE — Telephone Encounter (Signed)
Copied from CRM [id number]. Topic: Referral - Status >> Mar 10, 2023  3:49 PM Macon Large wrote: Reason for CRM: Pt stated that he has not heard from anyone regarding referral for dermatology. Pt stated he was told to call back if he had not heard from anyone by today. Cb# [phone]

## 2023-03-15 ENCOUNTER — Telehealth: Payer: Self-pay

## 2023-03-15 NOTE — Telephone Encounter (Signed)
No longer wants to see Dr. Cheree Ditto, wants another derm doctor.  Pt understands booked up.

## 2023-03-15 NOTE — Telephone Encounter (Signed)
 Will place new referral.

## 2023-03-16 ENCOUNTER — Other Ambulatory Visit: Payer: Self-pay | Admitting: Internal Medicine

## 2023-03-16 DIAGNOSIS — Z1283 Encounter for screening for malignant neoplasm of skin: Secondary | ICD-10-CM

## 2023-03-22 ENCOUNTER — Ambulatory Visit (INDEPENDENT_AMBULATORY_CARE_PROVIDER_SITE_OTHER): Payer: Medicare HMO | Admitting: Internal Medicine

## 2023-03-22 ENCOUNTER — Encounter: Payer: Self-pay | Admitting: Internal Medicine

## 2023-03-22 VITALS — BP 116/82 | HR 78 | Temp 98.5°F | Resp 18 | Ht 71.0 in | Wt 189.0 lb

## 2023-03-22 DIAGNOSIS — Z0000 Encounter for general adult medical examination without abnormal findings: Secondary | ICD-10-CM | POA: Diagnosis not present

## 2023-03-22 DIAGNOSIS — Z125 Encounter for screening for malignant neoplasm of prostate: Secondary | ICD-10-CM | POA: Diagnosis not present

## 2023-03-22 DIAGNOSIS — E7800 Pure hypercholesterolemia, unspecified: Secondary | ICD-10-CM | POA: Diagnosis not present

## 2023-03-22 NOTE — Patient Instructions (Signed)
It was great seeing you today!  Plan discussed at today's visit: -Blood work ordered today, results will be uploaded to MyChart.   Follow up in: 1 year  Take care and let us know if you have any questions or concerns prior to your next visit.  Dr. Caralee Ates  Pneumococcal Conjugate Vaccine (PCV20) Injection What is this medication? PNEUMOCOCCAL CONJUGATE VACCINE (NEU mo KOK al kon ju gate vak SEEN) reduces the risk of pneumococcal disease, such as pneumonia. It does not treat pneumococcal disease. It is still possible to get pneumococcal disease after receiving this vaccine, but the symptoms may be less severe or not last as long. It works by helping your immune system learn how to fight off a future infection. This medicine may be used for other purposes; ask your health care provider or pharmacist if you have questions. COMMON BRAND NAME(S): Prevnar 20 What should I tell my care team before I take this medication? They need to know if you have any of these conditions: Bleeding disorder Fever Immune system problems An unusual or allergic reaction to pneumococcal vaccine, diphtheria toxoid, other vaccines, other medications, foods, dyes, or preservatives Pregnant or trying to get pregnant Breastfeeding How should I use this medication? This vaccine is injected into a muscle. It is given by your care team. A copy of Vaccine Information Statements will be given before each vaccination. Be sure to read this information carefully each time. This sheet may change often. Talk to your care team about the use of this medication in children. While it may be given to children as young as 6 weeks for selected conditions, precautions do apply. Overdosage: If you think you have taken too much of this medicine contact a poison control center or emergency room at once. NOTE: This medicine is only for you. Do not share this medicine with others. What if I miss a dose? This does not apply. This medication  is not for regular use. What may interact with this medication? Medications for cancer chemotherapy Medications that suppress your immune function Steroid medications, such as prednisone or cortisone This list may not describe all possible interactions. Give your health care provider a list of all the medicines, herbs, non-prescription drugs, or dietary supplements you use. Also tell them if you smoke, drink alcohol, or use illegal drugs. Some items may interact with your medicine. What should I watch for while using this medication? Visit your care team regularly. Report any side effects to your care team right away. This vaccine, like all vaccines, may not fully protect everyone. What side effects may I notice from receiving this medication? Side effects that you should report to your care team as soon as possible: Allergic reactions--skin rash, itching, hives, swelling of the face, lips, tongue, or throat Side effects that usually do not require medical attention (report these to your care team if they continue or are bothersome): Fatigue Fever Headache Joint pain Muscle pain Pain, redness, or irritation at injection site This list may not describe all possible side effects. Call your doctor for medical advice about side effects. You may report side effects to FDA at 1-800-FDA-1088. Where should I keep my medication? This vaccine is only given by your care team. It will not be stored at home. NOTE: This sheet is a summary. It may not cover all possible information. If you have questions about this medicine, talk to your doctor, pharmacist, or health care provider.  2024 Elsevier/Gold Standard (2021-09-30 00:00:00)

## 2023-03-22 NOTE — Progress Notes (Signed)
Name: Jeremiah Beck   MRN: [id number]    DOB: 09-Apr-1951   Date:03/22/2023       Progress Note  Subjective  Chief Complaint  Chief Complaint  Patient presents with   Annual Exam    HPI  Patient presents for annual CPE.  IPSS Questionnaire (AUA-7): Over the past month.   1)  How often have you had a sensation of not emptying your bladder completely after you finish urinating?  2 - Less than half the time  2)  How often have you had to urinate again less than two hours after you finished urinating? 1 - Less than 1 time in 5  3)  How often have you found you stopped and started again several times when you urinated?  0 - Not at all  4) How difficult have you found it to postpone urination?  0 - Not at all  5) How often have you had a weak urinary stream?  1 - Less than 1 time in 5  6) How often have you had to push or strain to begin urination?  0 - Not at all  7) How many times did you most typically get up to urinate from the time you went to bed until the time you got up in the morning?  2 - 2 times  Total score:  0-7 mildly symptomatic   8-19 moderately symptomatic   20-35 severely symptomatic     Diet: well rounded, eats out  Exercise: active outside, no regularly planned exercise Last Dental Exam: UTD Last Eye Exam: UTD  Depression: phq 9 is negative    03/22/2023    3:47 PM 09/24/2022    9:45 AM 02/09/2022    1:05 PM 02/09/2022    1:02 PM 10/15/2021    2:54 PM  Depression screen PHQ 2/9  Decreased Interest 0 0 0 0 0  Down, Depressed, Hopeless 0 0 0 0 0  PHQ - 2 Score 0 0 0 0 0  Altered sleeping 0  0 0   Tired, decreased energy 0  0 0   Change in appetite 0  0 0   Feeling bad or failure about yourself  0  0 0   Trouble concentrating 0  0 0   Moving slowly or fidgety/restless 0  0 0   Suicidal thoughts 0  0 0   PHQ-9 Score 0  0 0   Difficult doing work/chores Not difficult at all  Not difficult at all Not difficult at all     Hypertension:  BP Readings from  Last 3 Encounters:  03/22/23 116/82  02/11/22 135/83  02/09/22 128/62    Obesity: Wt Readings from Last 3 Encounters:  03/22/23 189 lb (85.7 kg)  09/24/22 191 lb (86.6 kg)  02/11/22 191 lb (86.6 kg)   BMI Readings from Last 3 Encounters:  03/22/23 26.36 kg/m  09/24/22 26.64 kg/m  02/11/22 26.64 kg/m     Lipids:  Lab Results  Component Value Date   CHOL 194 08/14/2020   CHOL 203 (H) 05/01/2019   CHOL 186 01/29/2019   Lab Results  Component Value Date   HDL 52 08/14/2020   HDL 57 05/01/2019   HDL 53 01/29/2019   Lab Results  Component Value Date   LDLCALC 127 (H) 08/14/2020   LDLCALC 129 (H) 05/01/2019   LDLCALC 117 (H) 01/29/2019   Lab Results  Component Value Date   TRIG 62 08/14/2020   TRIG 76 05/01/2019   TRIG 87  01/29/2019   Lab Results  Component Value Date   CHOLHDL 3.7 08/14/2020   CHOLHDL 3.6 05/01/2019   CHOLHDL 3.5 01/29/2019   No results found for: "LDLDIRECT" Glucose:  Glucose, Bld  Date Value Ref Range Status  01/05/2022 136 (H) 70 - 99 mg/dL Final    Comment:    Glucose reference range applies only to samples taken after fasting for at least 8 hours.  08/14/2020 86 65 - 99 mg/dL Final    Comment:    .            Fasting reference interval .   05/01/2019 81 65 - 99 mg/dL Final    Comment:    .            Fasting reference interval .     Flowsheet Row Clinical Support from 09/24/2022 in [facility]  AUDIT-C Score 0       Divorced STD testing and prevention (HIV/chl/gon/syphilis):complete  Hep C Screening: complete Skin cancer: Discussed monitoring for atypical lesions Colorectal cancer: cologuard 07/15/21 Prostate cancer:  yes Lab Results  Component Value Date   PSA 0.30 08/14/2020    Lung cancer:  Low Dose CT Chest recommended if Age 28-80 years, 30 pack-year currently smoking OR have quit w/in 15years. Patient  not applicable a candidate for screening   AAA: The USPSTF recommends one-time  screening with ultrasonography in men ages 58 to 75 years who have ever smoked. Patient   no, a candidate for screening  ECG:  01/15/22  Vaccines:   HPV: n/a Tdap: 04/28/21 Shingrix: refused - discussed obtaining at the pharmacy if he changes his mind  Pneumonia: Due for Prevnar 20 - patient will think about it, info printed Flu: due, declines today COVID-19:refused   Patient Active Problem List   Diagnosis Date Noted   Umbilical hernia without obstruction and without gangrene    Incarcerated left inguinal hernia    Pure hypercholesterolemia 01/16/2019   Degenerative arthritis of cervical spine 02/23/2017   Benign prostatic hyperplasia    ED (erectile dysfunction)     Past Surgical History:  Procedure Laterality Date   HERNIA REPAIR Left    inguinal   INSERTION OF MESH  01/14/2022   Procedure: INSERTION OF MESH;  Surgeon: Henrene Dodge, MD;  Location: [facility] ORS;  Service: General;;   SKIN CANCER EXCISION     removed from face   UMBILICAL HERNIA REPAIR N/A 01/14/2022   Procedure: HERNIA REPAIR UMBILICAL ADULT, open;  Surgeon: Henrene Dodge, MD;  Location: [facility] ORS;  Service: General;  Laterality: N/A;    Family History  Problem Relation Age of Onset   Cancer Father    Alcohol abuse Father    Cancer Mother    Varicose Veins Paternal Grandmother     Social History   Socioeconomic History   Marital status: Divorced    Spouse name: Not on file   Number of children: 0   Years of education: Not on file   Highest education level: Some college, no degree  Occupational History   Occupation: retired  Tobacco Use   Smoking status: Never   Smokeless tobacco: Never  Vaping Use   Vaping status: Never Used  Substance and Sexual Activity   Alcohol use: No   Drug use: No   Sexual activity: Not Currently  Other Topics Concern   Not on file  Social History Narrative   Not on file   Social Determinants of Corporate investment banker  Strain: Low Risk  (09/24/2022)    Overall Financial Resource Strain (CARDIA)    Difficulty of Paying Living Expenses: Not hard at all  Food Insecurity: No Food Insecurity (09/24/2022)   Hunger Vital Sign    Worried About Running Out of Food in the Last Year: Never true    Ran Out of Food in the Last Year: Never true  Transportation Needs: No Transportation Needs (10/15/2021)   PRAPARE - Administrator, Civil Service (Medical): No    Lack of Transportation (Non-Medical): No  Physical Activity: Sufficiently Active (09/24/2022)   Exercise Vital Sign    Days of Exercise per Week: 7 days    Minutes of Exercise per Session: 30 min  Stress: No Stress Concern Present (09/24/2022)   [employer] of Occupational Health - Occupational Stress Questionnaire    Feeling of Stress : Not at all  Social Connections: Socially Integrated (09/24/2022)   Social Connection and Isolation Panel [NHANES]    Frequency of Communication with Friends and Family: More than three times a week    Frequency of Social Gatherings with Friends and Family: More than three times a week    Attends Religious Services: More than 4 times per year    Active Member of [employer] or Organizations: Yes    Attends Engineer, structural: More than 4 times per year    Marital Status: Married  Catering manager Violence: Not At Risk (09/24/2022)   Humiliation, Afraid, Rape, and Kick questionnaire    Fear of Current or Ex-Partner: No    Emotionally Abused: No    Physically Abused: No    Sexually Abused: No     Current Outpatient Medications:    acetaminophen (TYLENOL) 500 MG tablet, Take 2 tablets (1,000 mg total) by mouth every 6 (six) hours as needed for mild pain., Disp: , Rfl:    Apple Cider Vinegar 300 MG TABS, Take by mouth., Disp: , Rfl:    finasteride (PROSCAR) 5 MG tablet, TAKE 1 TABLET BY MOUTH ONCE DAILY, Disp: 90 tablet, Rfl: 0   Garlic 100 MG TABS, Take by mouth., Disp: , Rfl:    Multiple Vitamins-Minerals (ONE-A-DAY MENS 50+ ADVANTAGE)  TABS, Take by mouth., Disp: , Rfl:    Omega-3 Fatty Acids (FISH OIL) 1000 MG CAPS, Take by mouth., Disp: , Rfl:    Turmeric 500 MG CAPS, Take by mouth., Disp: , Rfl:   Allergies  Allergen Reactions   Benadryl [Diphenhydramine] Other (See Comments)    Extreme drowsiness     Review of Systems  All other systems reviewed and are negative.     Objective  Vitals:   03/22/23 1541  BP: 116/82  Pulse: 78  Resp: 18  Temp: 98.5 F (36.9 C)  SpO2: 97%  Weight: 189 lb (85.7 kg)  Height: 5\' 11"  (1.803 m)    Body mass index is 26.36 kg/m.  Physical Exam Constitutional:      Appearance: Normal appearance.  HENT:     Head: Normocephalic and atraumatic.     Mouth/Throat:     Mouth: Mucous membranes are moist.     Pharynx: Oropharynx is clear.  Eyes:     Extraocular Movements: Extraocular movements intact.     Conjunctiva/sclera: Conjunctivae normal.     Pupils: Pupils are equal, round, and reactive to light.  Neck:     Comments: No thyromegaly  Cardiovascular:     Rate and Rhythm: Normal rate and regular rhythm.  Pulmonary:  Effort: Pulmonary effort is normal.     Breath sounds: Normal breath sounds.  Musculoskeletal:     Cervical back: No tenderness.  Lymphadenopathy:     Cervical: No cervical adenopathy.  Skin:    General: Skin is warm and dry.  Neurological:     General: No focal deficit present.     Mental Status: He is alert. Mental status is at baseline.  Psychiatric:        Mood and Affect: Mood normal.        Behavior: Behavior normal.     No results found for this or any previous visit (from the past 2160 hour(s)).   Fall Risk:    03/22/2023    3:47 PM 09/24/2022    9:51 AM 02/11/2022    2:13 PM 02/09/2022    1:05 PM 02/09/2022    1:02 PM  Fall Risk   Falls in the past year? 0 0 0 0 0  Number falls in past yr: 0 0  0 0  Injury with Fall? 0 0  0 0  Risk for fall due to :  No Fall Risks   No Fall Risks  Follow up  Falls prevention  discussed;Falls evaluation completed   Falls prevention discussed;Education provided     Functional Status Survey: Is the patient deaf or have difficulty hearing?: Yes Does the patient have difficulty seeing, even when wearing glasses/contacts?: Yes Does the patient have difficulty concentrating, remembering, or making decisions?: No Does the patient have difficulty walking or climbing stairs?: No Does the patient have difficulty dressing or bathing?: No Does the patient have difficulty doing errands alone such as visiting a doctor's office or shopping?: No    Assessment & Plan  1. Annual physical exam/Pure hypercholesterolemia/Prostate cancer screening: Physical exam completed, health maintenance reviewed and annual labs ordered.   - CBC w/Diff/Platelet - COMPLETE METABOLIC PANEL WITH GFR - Lipid Profile - PSA  -Prostate cancer screening and PSA options (with potential risks and benefits of testing vs not testing) were discussed along with recent recs/guidelines. -USPSTF grade A and B recommendations reviewed with patient; age-appropriate recommendations, preventive care, screening tests, etc discussed and encouraged; healthy living encouraged; see AVS for patient education given to patient -Discussed importance of 150 minutes of physical activity weekly, eat two servings of fish weekly, eat one serving of tree nuts ( cashews, pistachios, pecans, almonds.Marland Kitchen) every other day, eat 6 servings of fruit/vegetables daily and drink plenty of water and avoid sweet beverages.  -Reviewed Health Maintenance: yes

## 2023-03-23 LAB — CBC WITH DIFFERENTIAL/PLATELET
Absolute Lymphocytes: 2193 {cells}/uL (ref 850–3900)
Absolute Monocytes: 1037 {cells}/uL — ABNORMAL HIGH (ref 200–950)
Basophils Absolute: 77 {cells}/uL (ref 0–200)
Basophils Relative: 0.9 %
Eosinophils Absolute: 391 {cells}/uL (ref 15–500)
Eosinophils Relative: 4.6 %
HCT: 45.3 % (ref 38.5–50.0)
Hemoglobin: 15.4 g/dL (ref 13.2–17.1)
MCH: 32.4 pg (ref 27.0–33.0)
MCHC: 34 g/dL (ref 32.0–36.0)
MCV: 95.2 fL (ref 80.0–100.0)
MPV: 11.8 fL (ref 7.5–12.5)
Monocytes Relative: 12.2 %
Neutro Abs: 4803 {cells}/uL (ref 1500–7800)
Neutrophils Relative %: 56.5 %
Platelets: 301 10*3/uL (ref 140–400)
RBC: 4.76 10*6/uL (ref 4.20–5.80)
RDW: 11.8 % (ref 11.0–15.0)
Total Lymphocyte: 25.8 %
WBC: 8.5 10*3/uL (ref 3.8–10.8)

## 2023-03-23 LAB — COMPLETE METABOLIC PANEL WITH GFR
AG Ratio: 1.4 (calc) (ref 1.0–2.5)
ALT: 12 U/L (ref 9–46)
AST: 16 U/L (ref 10–35)
Albumin: 4.2 g/dL (ref 3.6–5.1)
Alkaline phosphatase (APISO): 87 U/L (ref 35–144)
BUN: 12 mg/dL (ref 7–25)
CO2: 29 mmol/L (ref 20–32)
Calcium: 9.7 mg/dL (ref 8.6–10.3)
Chloride: 101 mmol/L (ref 98–110)
Creat: 0.87 mg/dL (ref 0.70–1.28)
Globulin: 3 g/dL (ref 1.9–3.7)
Glucose, Bld: 91 mg/dL (ref 65–99)
Potassium: 5 mmol/L (ref 3.5–5.3)
Sodium: 138 mmol/L (ref 135–146)
Total Bilirubin: 0.6 mg/dL (ref 0.2–1.2)
Total Protein: 7.2 g/dL (ref 6.1–8.1)
eGFR: 92 mL/min/{1.73_m2} (ref >=60)

## 2023-03-23 LAB — LIPID PANEL
Cholesterol: 188 mg/dL (ref <200)
HDL: 56 mg/dL (ref >=40)
LDL Cholesterol (Calc): 104 mg/dL — ABNORMAL HIGH
Non-HDL Cholesterol (Calc): 132 mg/dL — ABNORMAL HIGH (ref <130)
Total CHOL/HDL Ratio: 3.4 (calc) (ref <5.0)
Triglycerides: 161 mg/dL — ABNORMAL HIGH (ref <150)

## 2023-03-23 LAB — PSA: PSA: 0.12 ng/mL (ref <=4.00)

## 2023-05-28 ENCOUNTER — Other Ambulatory Visit: Payer: Self-pay | Admitting: Internal Medicine

## 2023-05-28 DIAGNOSIS — N401 Enlarged prostate with lower urinary tract symptoms: Secondary | ICD-10-CM

## 2023-05-28 DIAGNOSIS — Z5181 Encounter for therapeutic drug level monitoring: Secondary | ICD-10-CM

## 2023-05-30 NOTE — Telephone Encounter (Signed)
Requested Prescriptions  Pending Prescriptions Disp Refills   finasteride (PROSCAR) 5 MG tablet [Pharmacy Med Name: FINASTERIDE 5 MG TAB] 90 tablet 2    Sig: TAKE 1 TABLET BY MOUTH ONCE DAILY     Urology: 5-alpha Reductase Inhibitors Passed - 05/30/2023  5:22 PM      Passed - PSA in normal range and within 360 days    PSA  Date Value Ref Range Status  03/22/2023 0.12 < OR = 4.00 ng/mL Final    Comment:    The total PSA value from this assay system is  standardized against the WHO standard. The test  result will be approximately 20% lower when compared  to the equimolar-standardized total PSA (Beckman  Coulter). Comparison of serial PSA results should be  interpreted with this fact in mind. . This test was performed using the Siemens  chemiluminescent method. Values obtained from  different assay methods cannot be used interchangeably. PSA levels, regardless of value, should not be interpreted as absolute evidence of the presence or absence of disease.    Prostate Specific Ag, Serum  Date Value Ref Range Status  01/29/2019 0.2 0.0 - 4.0 ng/mL Final    Comment:    Roche ECLIA methodology. According to the American Urological Association, Serum PSA should decrease and remain at undetectable levels after radical prostatectomy. The AUA defines biochemical recurrence as an initial PSA value 0.2 ng/mL or greater followed by a subsequent confirmatory PSA value 0.2 ng/mL or greater. Values obtained with different assay methods or kits cannot be used interchangeably. Results cannot be interpreted as absolute evidence of the presence or absence of malignant disease.          Passed - Valid encounter within last 12 months    Recent Outpatient Visits           2 months ago Annual physical exam   [facility] Margarita Mail, DO   1 year ago Testicular swelling, left   [facility] Margarita Mail, DO   2 years ago Annual  physical exam   [facility] Margarita Mail, DO   2 years ago Pure hypercholesterolemia   [facility] Danelle Berry, PA-C   2 years ago Pure hypercholesterolemia   [facility] Danelle Berry, PA-C       Future Appointments             In 8 months Deirdre Evener, MD [facility]   In 9 months Margarita Mail, DO [facility]

## 2023-09-13 ENCOUNTER — Ambulatory Visit

## 2023-09-20 ENCOUNTER — Ambulatory Visit: Admitting: Podiatry

## 2023-09-20 ENCOUNTER — Encounter: Payer: Self-pay | Admitting: Podiatry

## 2023-09-20 DIAGNOSIS — G5791 Unspecified mononeuropathy of right lower limb: Secondary | ICD-10-CM

## 2023-09-20 NOTE — Progress Notes (Signed)
   Chief Complaint  Patient presents with   Foot Pain    "I'm having tingling, numbness in my feet." N - tingling feet L - bilateral - tingling D - months O - off and on - tingling C - tingling, numbness A - none T - Tylenol     HPI: 72 y.o. male presenting today as a new patient for evaluation of intermittent sharp stabbing tingling sensation to the feet.  He says he gets an episode about 1-2 times per month.  It will be sharp stabbing sensation which goes away within a matter of 3 minutes.  He cannot recall an eliciting factor.  He has not anything for treatment  Past Medical History:  Diagnosis Date   BPH (benign prostatic hypertrophy)    Cancer (HCC)    skin   COVID 10/2021   home test   Degenerative arthritis of cervical spine 02/23/2017   xrays Oct 2018   ED (erectile dysfunction)     Past Surgical History:  Procedure Laterality Date   HERNIA REPAIR Left    inguinal   INSERTION OF MESH  01/14/2022   Procedure: INSERTION OF MESH;  Surgeon: Emmalene Hare, MD;  Location: [facility];  Service: General;;   SKIN CANCER EXCISION     removed from face   UMBILICAL HERNIA REPAIR N/A 01/14/2022   Procedure: HERNIA REPAIR UMBILICAL ADULT, open;  Surgeon: Emmalene Hare, MD;  Location: [facility];  Service: General;  Laterality: N/A;    Allergies  Allergen Reactions   Benadryl [Diphenhydramine] Other (See Comments)    Extreme drowsiness     Physical Exam: General: The patient is alert and oriented x3 in no acute distress.  Dermatology: Skin is warm, dry and supple bilateral lower extremities.   Vascular: Palpable pedal pulses bilaterally. Capillary refill within normal limits.  No appreciable edema.  No erythema.  Neurological: Grossly intact via light touch  Musculoskeletal Exam: No pedal deformities noted   Assessment/Plan of Care: 1.  Intermittent neuritis right foot  -Patient evaluated -Nerve etiology.  Explained that there must be some sort of eliciting factor  either activity or shoes that is causing the inflammation of the nerve -Recommend wide fitting shoes that do not irritate or constrict the foot -Gabapentin  offered for the patient but ultimately declined -Return to clinic PRN       Dot Gazella, DPM [facility]  Dr. Dot Gazella, DPM    [address] [place], [place] [address]                Office [phone]  Fax [phone]

## 2023-09-29 ENCOUNTER — Encounter: Payer: Self-pay | Admitting: Ophthalmology

## 2023-09-29 NOTE — Anesthesia Preprocedure Evaluation (Addendum)
 Anesthesia Evaluation  Patient identified by MRN, date of birth, ID band Patient awake    Reviewed: Allergy & Precautions, H&P , NPO status , Patient's Chart, lab work & pertinent test results  Airway Mallampati: III  TM Distance: >3 FB Neck ROM: Full    Dental no notable dental hx.    Pulmonary neg pulmonary ROS   Pulmonary exam normal breath sounds clear to auscultation       Cardiovascular negative cardio ROS Normal cardiovascular exam Rhythm:Regular Rate:Normal     Neuro/Psych negative neurological ROS  negative psych ROS   GI/Hepatic negative GI ROS, Neg liver ROS,,,  Endo/Other  negative endocrine ROS    Renal/GU negative Renal ROS  negative genitourinary   Musculoskeletal negative musculoskeletal ROS (+)    Abdominal   Peds negative pediatric ROS (+)  Hematology negative hematology ROS (+)   Anesthesia Other Findings BPH (benign prostatic hypertrophy)  ED (erectile dysfunction) Degenerative arthritis of cervical spine  Cancer (HCC) COVID     Reproductive/Obstetrics negative OB ROS                             Anesthesia Physical Anesthesia Plan  ASA: 2  Anesthesia Plan: MAC   Post-op Pain Management:    Induction: Intravenous  PONV Risk Score and Plan:   Airway Management Planned: Natural Airway and Nasal Cannula  Additional Equipment:   Intra-op Plan:   Post-operative Plan:   Informed Consent: I have reviewed the patients History and Physical, chart, labs and discussed the procedure including the risks, benefits and alternatives for the proposed anesthesia with the patient or authorized representative who has indicated his/her understanding and acceptance.     Dental Advisory Given  Plan Discussed with: Anesthesiologist, CRNA and Surgeon  Anesthesia Plan Comments: (Patient consented for risks of anesthesia including but not limited to:  - adverse  reactions to medications - damage to eyes, teeth, lips or other oral mucosa - nerve damage due to positioning  - sore throat or hoarseness - Damage to heart, brain, nerves, lungs, other parts of body or loss of life  Patient voiced understanding and assent.)        Anesthesia Quick Evaluation

## 2023-10-04 NOTE — Discharge Instructions (Signed)

## 2023-10-05 ENCOUNTER — Ambulatory Visit
Admission: RE | Admit: 2023-10-05 | Discharge: 2023-10-05 | Disposition: A | Discharge status: Home / Self Care | Attending: Ophthalmology | Admitting: Ophthalmology

## 2023-10-05 ENCOUNTER — Other Ambulatory Visit: Payer: Self-pay

## 2023-10-05 ENCOUNTER — Ambulatory Visit: Payer: Self-pay | Admitting: Anesthesiology

## 2023-10-05 ENCOUNTER — Encounter
Admission: RE | Disposition: A | Discharge status: Home / Self Care | Payer: Self-pay | Source: Home / Self Care | Attending: Ophthalmology

## 2023-10-05 ENCOUNTER — Encounter: Payer: Self-pay | Admitting: Ophthalmology

## 2023-10-05 DIAGNOSIS — H2512 Age-related nuclear cataract, left eye: Secondary | ICD-10-CM | POA: Insufficient documentation

## 2023-10-05 DIAGNOSIS — N400 Enlarged prostate without lower urinary tract symptoms: Secondary | ICD-10-CM | POA: Insufficient documentation

## 2023-10-05 DIAGNOSIS — Z79899 Other long term (current) drug therapy: Secondary | ICD-10-CM | POA: Insufficient documentation

## 2023-10-05 HISTORY — PX: CATARACT EXTRACTION W/PHACO: SHX586

## 2023-10-05 SURGERY — PHACOEMULSIFICATION, CATARACT, WITH IOL INSERTION
Anesthesia: Monitor Anesthesia Care | Site: Eye | Laterality: Left

## 2023-10-05 MED ORDER — LIDOCAINE HCL (PF) 2 % IJ SOLN
INTRAOCULAR | Status: DC | PRN
  Administered 2023-10-05: 2 mL

## 2023-10-05 MED ORDER — ARMC OPHTHALMIC DILATING DROPS
1.0000 | OPHTHALMIC | Status: DC | PRN
  Administered 2023-10-05 (×3): 1 via OPHTHALMIC

## 2023-10-05 MED ORDER — TETRACAINE HCL 0.5 % OP SOLN
1.0000 [drp] | OPHTHALMIC | Status: DC | PRN
  Administered 2023-10-05 (×3): 1 [drp] via OPHTHALMIC

## 2023-10-05 MED ORDER — BRIMONIDINE TARTRATE-TIMOLOL 0.2-0.5 % OP SOLN
OPHTHALMIC | Status: DC | PRN
  Administered 2023-10-05: 1 [drp] via OPHTHALMIC

## 2023-10-05 MED ORDER — MIDAZOLAM HCL 2 MG/2ML IJ SOLN
INTRAMUSCULAR | Status: AC
  Filled 2023-10-05: qty 2

## 2023-10-05 MED ORDER — SIGHTPATH DOSE#1 NA HYALUR & NA CHOND-NA HYALUR IO KIT
PACK | INTRAOCULAR | Status: DC | PRN
  Administered 2023-10-05: 1 via OPHTHALMIC

## 2023-10-05 MED ORDER — CEFUROXIME OPHTHALMIC INJECTION 1 MG/0.1 ML
INJECTION | OPHTHALMIC | Status: DC | PRN
Start: 2023-10-05 — End: 2023-10-05
  Administered 2023-10-05: .1 mL via INTRACAMERAL

## 2023-10-05 MED ORDER — TETRACAINE HCL 0.5 % OP SOLN
OPHTHALMIC | Status: AC
  Filled 2023-10-05: qty 4

## 2023-10-05 MED ORDER — FENTANYL CITRATE (PF) 100 MCG/2ML IJ SOLN
INTRAMUSCULAR | Status: DC | PRN
  Administered 2023-10-05: 50 ug via INTRAVENOUS

## 2023-10-05 MED ORDER — LACTATED RINGERS IV SOLN: INTRAVENOUS | Status: DC

## 2023-10-05 MED ORDER — EPINEPHRINE PF 1 MG/ML IJ SOLN
INTRAMUSCULAR | Status: DC | PRN
  Administered 2023-10-05: 74 mL via OPHTHALMIC

## 2023-10-05 MED ORDER — ARMC OPHTHALMIC DILATING DROPS
OPHTHALMIC | Status: AC
Start: 2023-10-05 — End: ?
  Filled 2023-10-05: qty 0.5

## 2023-10-05 MED ORDER — MIDAZOLAM HCL 2 MG/2ML IJ SOLN
INTRAMUSCULAR | Status: DC | PRN
  Administered 2023-10-05 (×2): .5 mg via INTRAVENOUS

## 2023-10-05 MED ORDER — SIGHTPATH DOSE#1 BSS IO SOLN
INTRAOCULAR | Status: DC | PRN
  Administered 2023-10-05: 15 mL via INTRAOCULAR

## 2023-10-05 MED ORDER — FENTANYL CITRATE (PF) 100 MCG/2ML IJ SOLN
INTRAMUSCULAR | Status: AC
Start: 2023-10-05 — End: ?
  Filled 2023-10-05: qty 2

## 2023-10-05 SURGICAL SUPPLY — 10 items
CATARACT SUITE SIGHTPATH (MISCELLANEOUS) ×1 IMPLANT
FEE CATARACT SUITE SIGHTPATH (MISCELLANEOUS) ×1 IMPLANT
GLOVE BIOGEL PI IND STRL 8 (GLOVE) ×1 IMPLANT
GLOVE SURG LX STRL 7.5 STRW (GLOVE) ×1 IMPLANT
GLOVE SURG PROTEXIS BL SZ6.5 (GLOVE) ×1 IMPLANT
GLOVE SURG SYN 6.5 PF PI BL (GLOVE) ×1 IMPLANT
LENS IOL TECNIS EYHANCE 13.5 (Intraocular Lens) IMPLANT
NDL FILTER BLUNT 18X1 1/2 (NEEDLE) ×1 IMPLANT
NEEDLE FILTER BLUNT 18X1 1/2 (NEEDLE) ×1 IMPLANT
SYR 3ML LL SCALE MARK (SYRINGE) ×1 IMPLANT

## 2023-10-05 NOTE — Transfer of Care (Signed)
 Immediate Anesthesia Transfer of Care Note  Patient: Jeremiah Beck  Procedure(s) Performed: PHACOEMULSIFICATION, CATARACT, WITH IOL INSERTION 10.86 01:05.7 (Left: Eye)  Patient Location: PACU  Anesthesia Type: MAC  Level of Consciousness: awake, alert  and patient cooperative  Airway and Oxygen Therapy: Patient Spontanous Breathing and Patient connected to supplemental oxygen  Post-op Assessment: Post-op Vital signs reviewed, Patient's Cardiovascular Status Stable, Respiratory Function Stable, Patent Airway and No signs of Nausea or vomiting  Post-op Vital Signs: Reviewed and stable  Complications: No notable events documented.

## 2023-10-05 NOTE — H&P (Signed)
 [facility]   Primary Care Physician:  Rockney Cid, DO Ophthalmologist: Dr. Annell Kidney  Pre-Procedure History & Physical: HPI:  Jeremiah Beck is a 73 y.o. male here for ophthalmic surgery.   Past Medical History:  Diagnosis Date   BPH (benign prostatic hypertrophy)    Cancer (HCC)    skin   COVID 10/2021   home test   Degenerative arthritis of cervical spine 02/23/2017   xrays Oct 2018   ED (erectile dysfunction)     Past Surgical History:  Procedure Laterality Date   HERNIA REPAIR Left    inguinal   INSERTION OF MESH  01/14/2022   Procedure: INSERTION OF MESH;  Surgeon: Emmalene Hare, MD;  Location: [facility];  Service: General;;   SKIN CANCER EXCISION     removed from face   UMBILICAL HERNIA REPAIR N/A 01/14/2022   Procedure: HERNIA REPAIR UMBILICAL ADULT, open;  Surgeon: Emmalene Hare, MD;  Location: [facility];  Service: General;  Laterality: N/A;    Prior to Admission medications   Medication Sig Start Date End Date Taking? Authorizing Provider  acetaminophen  (TYLENOL ) 500 MG tablet Take 2 tablets (1,000 mg total) by mouth every 6 (six) hours as needed for mild pain. 01/14/22  Yes Piscoya, Volanda Gruber, MD  Apple Cider Vinegar 300 MG TABS Take by mouth.   Yes [provider]  finasteride  (PROSCAR ) 5 MG tablet TAKE 1 TABLET BY MOUTH ONCE DAILY 05/30/23  Yes Rockney Cid, DO  Multiple Vitamins-Minerals (ONE-A-DAY MENS 50+ ADVANTAGE) TABS Take by mouth.   Yes [provider]  Omega-3 Fatty Acids (FISH OIL) 1000 MG CAPS Take by mouth.   Yes [provider]  Turmeric 500 MG CAPS Take by mouth.   Yes [provider]  Garlic 100 MG TABS Take by mouth. Patient not taking: Reported on 09/29/2023    [provider]    Allergies as of 09/15/2023 - Review Complete 03/22/2023  Allergen Reaction Noted   Benadryl [diphenhydramine] Other (See Comments) 01/11/2022    Family History  Problem Relation Age of Onset    Cancer Father    Alcohol abuse Father    Cancer Mother    Varicose Veins Paternal Grandmother     Social History   Socioeconomic History   Marital status: Divorced    Spouse name: Not on file   Number of children: 0   Years of education: Not on file   Highest education level: Some college, no degree  Occupational History   Occupation: retired  Tobacco Use   Smoking status: Never   Smokeless tobacco: Never  Vaping Use   Vaping status: Never Used  Substance and Sexual Activity   Alcohol use: No    Comment: Rare   Drug use: No   Sexual activity: Not Currently  Other Topics Concern   Not on file  Social History Narrative   Not on file   Social Drivers of Health   Financial Resource Strain: Low Risk  (09/24/2022)   Overall Financial Resource Strain (CARDIA)    Difficulty of Paying Living Expenses: Not hard at all  Food Insecurity: No Food Insecurity (09/24/2022)   Hunger Vital Sign    Worried About Running Out of Food in the Last Year: Never true    Ran Out of Food in the Last Year: Never true  Transportation Needs: No Transportation Needs (10/15/2021)   PRAPARE - Administrator, Civil Service (Medical): No    Lack of Transportation (Non-Medical): No  Physical Activity: Sufficiently Active (09/24/2022)   Exercise Vital Sign    Days of Exercise per Week: 7 days    Minutes of Exercise per Session: 30 min  Stress: No Stress Concern Present (09/24/2022)   [employer] of Occupational Health - Occupational Stress Questionnaire    Feeling of Stress : Not at all  Social Connections: Socially Integrated (09/24/2022)   Social Connection and Isolation Panel [NHANES]    Frequency of Communication with Friends and Family: More than three times a week    Frequency of Social Gatherings with Friends and Family: More than three times a week    Attends Religious Services: More than 4 times per year    Active Member of [employer] or Organizations: Yes    Attends Museum/gallery exhibitions officer: More than 4 times per year    Marital Status: Married  Catering manager Violence: Not At Risk (09/24/2022)   Humiliation, Afraid, Rape, and Kick questionnaire    Fear of Current or Ex-Partner: No    Emotionally Abused: No    Physically Abused: No    Sexually Abused: No    Review of Systems: See HPI, otherwise negative ROS  Physical Exam: BP 124/80   Temp 97.8 F (36.6 C) (Temporal)   Resp 12   Ht 5' 10.98" (1.803 m)   Wt 83.5 kg   SpO2 96%   BMI 25.67 kg/m  General:   Alert,  pleasant and cooperative in NAD Head:  Normocephalic and atraumatic. Lungs:  Clear to auscultation.    Heart:  Regular rate and rhythm.   Impression/Plan: Jeremiah Beck is here for ophthalmic surgery.  Risks, benefits, limitations, and alternatives regarding ophthalmic surgery have been reviewed with the patient.  Questions have been answered.  All parties agreeable.   Annell Kidney, MD  10/05/2023, 11:59 AM

## 2023-10-05 NOTE — Op Note (Signed)
 OPERATIVE NOTE  Jeremiah Beck [id number] 10/05/2023   PREOPERATIVE DIAGNOSIS:  Nuclear sclerotic cataract left eye. H25.12   POSTOPERATIVE DIAGNOSIS:    Nuclear sclerotic cataract left eye.     PROCEDURE:  Phacoemusification with posterior chamber intraocular lens placement of the left eye  Ultrasound time: Procedure(s): PHACOEMULSIFICATION, CATARACT, WITH IOL INSERTION 10.86 01:05.7 (Left)  LENS:   Implant Name Type Inv. Item Serial No. Manufacturer Lot No. LRB No. Used Action  LENS IOL TECNIS EYHANCE 13.5 - [id number] Intraocular Lens LENS IOL TECNIS EYHANCE 13.5 [id number] [employer]  Left 1 Implanted      SURGEON:  Berline Brenner, MD   ANESTHESIA:  Topical with tetracaine drops and 2% Xylocaine  jelly, augmented with 1% preservative-free intracameral lidocaine .    COMPLICATIONS:  None.   DESCRIPTION OF PROCEDURE:  The patient was identified in the holding room and transported to the operating room and placed in the supine position under the operating microscope.  The left eye was identified as the operative eye and it was prepped and draped in the usual sterile ophthalmic fashion.   A 1 millimeter clear-corneal paracentesis was made at the 1:30 position.  0.5 ml of preservative-free 1% lidocaine  was injected into the anterior chamber.  The anterior chamber was filled with Viscoat viscoelastic.  A 2.4 millimeter keratome was used to make a near-clear corneal incision at the 10:30 position.  .  A curvilinear capsulorrhexis was made with a cystotome and capsulorrhexis forceps.  Balanced salt solution was used to hydrodissect and hydrodelineate the nucleus.   Phacoemulsification was then used in stop and chop fashion to remove the lens nucleus and epinucleus.  The remaining cortex was then removed using the irrigation and aspiration handpiece. Provisc was then placed into the capsular bag to distend it for lens placement.  A lens was then injected into the capsular bag.  The  remaining viscoelastic was aspirated.   Wounds were hydrated with balanced salt solution.  The anterior chamber was inflated to a physiologic pressure with balanced salt solution.  No wound leaks were noted. Cefuroxime 0.1 ml of a 10mg /ml solution was injected into the anterior chamber for a dose of 1 mg of intracameral antibiotic at the completion of the case.  e  Timolol and Brimonidine drops were applied to the eye.  The patient was taken to the recovery room in stable condition without complications of anesthesia or surgery.  Jeremiah Beck 10/05/2023, 1:10 PM

## 2023-10-05 NOTE — Anesthesia Postprocedure Evaluation (Signed)
 Anesthesia Post Note  Patient: Jeremiah Beck  Procedure(s) Performed: PHACOEMULSIFICATION, CATARACT, WITH IOL INSERTION 10.86 01:05.7 (Left: Eye)  Patient location during evaluation: PACU Anesthesia Type: MAC Level of consciousness: awake and alert Pain management: pain level controlled Vital Signs Assessment: post-procedure vital signs reviewed and stable Respiratory status: spontaneous breathing, nonlabored ventilation, respiratory function stable and patient connected to nasal cannula oxygen Cardiovascular status: stable and blood pressure returned to baseline Postop Assessment: no apparent nausea or vomiting Anesthetic complications: no   No notable events documented.   Last Vitals:  Vitals:   10/05/23 1313 10/05/23 1321  BP: 123/78 115/70  Pulse: (!) 56   Resp: (!) 22   Temp: 36.6 C 36.6 C  SpO2: 97%     Last Pain:  Vitals:   10/05/23 1313  TempSrc:   PainSc: 0-No pain                 Jaslynne Dahan C Alexius Ellington

## 2023-10-06 ENCOUNTER — Encounter: Payer: Self-pay | Admitting: Ophthalmology

## 2023-10-11 NOTE — Anesthesia Preprocedure Evaluation (Addendum)
 Anesthesia Evaluation  Patient identified by MRN, date of birth, ID band Patient awake    Reviewed: Allergy & Precautions, H&P , NPO status , Patient's Chart, lab work & pertinent test results  Airway Mallampati: III  TM Distance: >3 FB Neck ROM: Full    Dental no notable dental hx.    Pulmonary neg pulmonary ROS   Pulmonary exam normal breath sounds clear to auscultation       Cardiovascular negative cardio ROS Normal cardiovascular exam Rhythm:Regular Rate:Normal     Neuro/Psych negative neurological ROS  negative psych ROS   GI/Hepatic negative GI ROS, Neg liver ROS,,,  Endo/Other  negative endocrine ROS    Renal/GU negative Renal ROS  negative genitourinary   Musculoskeletal negative musculoskeletal ROS (+)    Abdominal   Peds negative pediatric ROS (+)  Hematology negative hematology ROS (+)   Anesthesia Other Findings BPH (benign prostatic hypertrophy)  ED (erectile dysfunction) Degenerative arthritis of cervical spine  Cancer (HCC) COVID     Reproductive/Obstetrics negative OB ROS                             Anesthesia Physical Anesthesia Plan  ASA: 2  Anesthesia Plan: MAC   Post-op Pain Management:    Induction: Intravenous  PONV Risk Score and Plan:   Airway Management Planned: Natural Airway and Nasal Cannula  Additional Equipment:   Intra-op Plan:   Post-operative Plan:   Informed Consent: I have reviewed the patients History and Physical, chart, labs and discussed the procedure including the risks, benefits and alternatives for the proposed anesthesia with the patient or authorized representative who has indicated his/her understanding and acceptance.       Plan Discussed with: Anesthesiologist, CRNA and Surgeon  Anesthesia Plan Comments:         Anesthesia Quick Evaluation

## 2023-10-17 NOTE — Discharge Instructions (Signed)

## 2023-10-19 ENCOUNTER — Ambulatory Visit: Payer: Self-pay | Admitting: Anesthesiology

## 2023-10-19 ENCOUNTER — Ambulatory Visit
Admission: RE | Admit: 2023-10-19 | Discharge: 2023-10-19 | Disposition: A | Discharge status: Home / Self Care | Attending: Ophthalmology | Admitting: Ophthalmology

## 2023-10-19 ENCOUNTER — Other Ambulatory Visit: Payer: Self-pay

## 2023-10-19 ENCOUNTER — Encounter: Payer: Self-pay | Admitting: Ophthalmology

## 2023-10-19 ENCOUNTER — Encounter
Admission: RE | Disposition: A | Discharge status: Home / Self Care | Payer: Self-pay | Source: Home / Self Care | Attending: Ophthalmology

## 2023-10-19 DIAGNOSIS — H2511 Age-related nuclear cataract, right eye: Secondary | ICD-10-CM | POA: Insufficient documentation

## 2023-10-19 HISTORY — PX: CATARACT EXTRACTION W/PHACO: SHX586

## 2023-10-19 SURGERY — PHACOEMULSIFICATION, CATARACT, WITH IOL INSERTION
Anesthesia: Monitor Anesthesia Care | Site: Eye | Laterality: Right

## 2023-10-19 MED ORDER — FENTANYL CITRATE (PF) 100 MCG/2ML IJ SOLN
INTRAMUSCULAR | Status: DC | PRN
  Administered 2023-10-19 (×2): 25 ug via INTRAVENOUS

## 2023-10-19 MED ORDER — TETRACAINE HCL 0.5 % OP SOLN
OPHTHALMIC | Status: AC
  Filled 2023-10-19: qty 4

## 2023-10-19 MED ORDER — SIGHTPATH DOSE#1 BSS IO SOLN
INTRAOCULAR | Status: DC | PRN
  Administered 2023-10-19: 15 mL via INTRAOCULAR

## 2023-10-19 MED ORDER — MIDAZOLAM HCL 2 MG/2ML IJ SOLN
INTRAMUSCULAR | Status: DC | PRN
Start: 2023-10-19 — End: 2023-10-19
  Administered 2023-10-19: 1 mg via INTRAVENOUS

## 2023-10-19 MED ORDER — ONDANSETRON HCL 4 MG/2ML IJ SOLN
INTRAMUSCULAR | Status: AC
  Filled 2023-10-19: qty 2

## 2023-10-19 MED ORDER — GLYCOPYRROLATE 0.2 MG/ML IJ SOLN
INTRAMUSCULAR | Status: DC | PRN
  Administered 2023-10-19: .1 mg via INTRAVENOUS

## 2023-10-19 MED ORDER — LIDOCAINE HCL (PF) 2 % IJ SOLN
INTRAOCULAR | Status: DC | PRN
  Administered 2023-10-19: 2 mL

## 2023-10-19 MED ORDER — SIGHTPATH DOSE#1 NA HYALUR & NA CHOND-NA HYALUR IO KIT
PACK | INTRAOCULAR | Status: DC | PRN
  Administered 2023-10-19: 1 via OPHTHALMIC

## 2023-10-19 MED ORDER — BRIMONIDINE TARTRATE-TIMOLOL 0.2-0.5 % OP SOLN
OPHTHALMIC | Status: DC | PRN
Start: 2023-10-19 — End: 2023-10-19
  Administered 2023-10-19: 1 [drp] via OPHTHALMIC

## 2023-10-19 MED ORDER — ARMC OPHTHALMIC DILATING DROPS
1.0000 | OPHTHALMIC | Status: DC | PRN
  Administered 2023-10-19 (×3): 1 via OPHTHALMIC

## 2023-10-19 MED ORDER — MIDAZOLAM HCL 2 MG/2ML IJ SOLN
INTRAMUSCULAR | Status: AC
  Filled 2023-10-19: qty 2

## 2023-10-19 MED ORDER — CEFUROXIME OPHTHALMIC INJECTION 1 MG/0.1 ML
INJECTION | OPHTHALMIC | Status: DC | PRN
  Administered 2023-10-19: .1 mL via INTRACAMERAL

## 2023-10-19 MED ORDER — FENTANYL CITRATE (PF) 100 MCG/2ML IJ SOLN
INTRAMUSCULAR | Status: AC
  Filled 2023-10-19: qty 2

## 2023-10-19 MED ORDER — TETRACAINE HCL 0.5 % OP SOLN
1.0000 [drp] | OPHTHALMIC | Status: DC | PRN
  Administered 2023-10-19 (×3): 1 [drp] via OPHTHALMIC

## 2023-10-19 MED ORDER — SIGHTPATH DOSE#1 BSS IO SOLN
INTRAOCULAR | Status: DC | PRN
  Administered 2023-10-19: 76 mL via OPHTHALMIC

## 2023-10-19 MED ORDER — ARMC OPHTHALMIC DILATING DROPS
OPHTHALMIC | Status: AC
Start: 2023-10-19 — End: 2023-10-19
  Filled 2023-10-19: qty 0.5

## 2023-10-19 SURGICAL SUPPLY — 10 items
CATARACT SUITE SIGHTPATH (MISCELLANEOUS) ×1 IMPLANT
FEE CATARACT SUITE SIGHTPATH (MISCELLANEOUS) ×1 IMPLANT
GLOVE BIOGEL PI IND STRL 8 (GLOVE) ×1 IMPLANT
GLOVE SURG LX STRL 7.5 STRW (GLOVE) ×1 IMPLANT
GLOVE SURG PROTEXIS BL SZ6.5 (GLOVE) ×1 IMPLANT
GLOVE SURG SYN 6.5 PF PI BL (GLOVE) ×1 IMPLANT
LENS IOL TECNIS EYHANCE 11.5 (Intraocular Lens) IMPLANT
NDL FILTER BLUNT 18X1 1/2 (NEEDLE) ×1 IMPLANT
NEEDLE FILTER BLUNT 18X1 1/2 (NEEDLE) ×1 IMPLANT
SYR 3ML LL SCALE MARK (SYRINGE) ×1 IMPLANT

## 2023-10-19 NOTE — Anesthesia Postprocedure Evaluation (Signed)
 Anesthesia Post Note  Patient: Jeremiah Beck  Procedure(s) Performed: PHACOEMULSIFICATION, CATARACT, WITH IOL INSERTION 7.18 00:49.4 (Right: Eye)  Patient location during evaluation: PACU Anesthesia Type: MAC Level of consciousness: awake and alert Pain management: pain level controlled Vital Signs Assessment: post-procedure vital signs reviewed and stable Respiratory status: spontaneous breathing, nonlabored ventilation and respiratory function stable Cardiovascular status: stable and blood pressure returned to baseline Postop Assessment: no apparent nausea or vomiting Anesthetic complications: no   No notable events documented.   Last Vitals:  Vitals:   10/19/23 1030 10/19/23 1032  BP: 115/83   Pulse: (!) 57 (!) 58  Resp: 14 16  Temp:    SpO2: 98% 97%    Last Pain:  Vitals:   10/19/23 1029  TempSrc:   PainSc: 0-No pain                 Baltazar Bonier

## 2023-10-19 NOTE — H&P (Signed)
 [facility]   Primary Care Physician:  Rockney Cid, DO Ophthalmologist: Dr. Annell Kidney  Pre-Procedure History & Physical: HPI:  Jeremiah Beck is a 73 y.o. male here for ophthalmic surgery.   Past Medical History:  Diagnosis Date   BPH (benign prostatic hypertrophy)    Cancer (HCC)    skin   COVID 10/2021   home test   Degenerative arthritis of cervical spine 02/23/2017   xrays Oct 2018   ED (erectile dysfunction)     Past Surgical History:  Procedure Laterality Date   CATARACT EXTRACTION W/PHACO Left 10/05/2023   Procedure: PHACOEMULSIFICATION, CATARACT, WITH IOL INSERTION 10.86 01:05.7;  Surgeon: Annell Kidney, MD;  Location: [facility];  Service: Ophthalmology;  Laterality: Left;   HERNIA REPAIR Left    inguinal   INSERTION OF MESH  01/14/2022   Procedure: INSERTION OF MESH;  Surgeon: Emmalene Hare, MD;  Location: [facility];  Service: General;;   SKIN CANCER EXCISION     removed from face   UMBILICAL HERNIA REPAIR N/A 01/14/2022   Procedure: HERNIA REPAIR UMBILICAL ADULT, open;  Surgeon: Emmalene Hare, MD;  Location: [facility];  Service: General;  Laterality: N/A;    Prior to Admission medications   Medication Sig Start Date End Date Taking? Authorizing Provider  acetaminophen  (TYLENOL ) 500 MG tablet Take 2 tablets (1,000 mg total) by mouth every 6 (six) hours as needed for mild pain. 01/14/22  Yes Piscoya, Volanda Gruber, MD  Apple Cider Vinegar 300 MG TABS Take by mouth.   Yes [provider]  finasteride  (PROSCAR ) 5 MG tablet TAKE 1 TABLET BY MOUTH ONCE DAILY 05/30/23  Yes Rockney Cid, DO  Multiple Vitamins-Minerals (ONE-A-DAY MENS 50+ ADVANTAGE) TABS Take by mouth.   Yes [provider]  Omega-3 Fatty Acids (FISH OIL) 1000 MG CAPS Take by mouth.   Yes [provider]  Turmeric 500 MG CAPS Take by mouth.   Yes [provider]  Garlic 100 MG TABS Take by mouth. Patient not taking: Reported on 10/19/2023     [provider]    Allergies as of 09/15/2023 - Review Complete 03/22/2023  Allergen Reaction Noted   Benadryl [diphenhydramine] Other (See Comments) 01/11/2022    Family History  Problem Relation Age of Onset   Cancer Father    Alcohol abuse Father    Cancer Mother    Varicose Veins Paternal Grandmother     Social History   Socioeconomic History   Marital status: Divorced    Spouse name: Not on file   Number of children: 0   Years of education: Not on file   Highest education level: Some college, no degree  Occupational History   Occupation: retired  Tobacco Use   Smoking status: Never   Smokeless tobacco: Never  Vaping Use   Vaping status: Never Used  Substance and Sexual Activity   Alcohol use: No    Comment: Rare   Drug use: No   Sexual activity: Not Currently  Other Topics Concern   Not on file  Social History Narrative   Not on file   Social Drivers of Health   Financial Resource Strain: Low Risk  (09/24/2022)   Overall Financial Resource Strain (CARDIA)    Difficulty of Paying Living Expenses: Not hard at all  Food Insecurity: No Food Insecurity (09/24/2022)   Hunger Vital Sign    Worried About Running Out of Food in the Last Year: Never true    Ran Out of Food in the  Last Year: Never true  Transportation Needs: No Transportation Needs (10/15/2021)   PRAPARE - Administrator, Civil Service (Medical): No    Lack of Transportation (Non-Medical): No  Physical Activity: Sufficiently Active (09/24/2022)   Exercise Vital Sign    Days of Exercise per Week: 7 days    Minutes of Exercise per Session: 30 min  Stress: No Stress Concern Present (09/24/2022)   [employer] of Occupational Health - Occupational Stress Questionnaire    Feeling of Stress : Not at all  Social Connections: Socially Integrated (09/24/2022)   Social Connection and Isolation Panel    Frequency of Communication with Friends and Family: More than three times a week     Frequency of Social Gatherings with Friends and Family: More than three times a week    Attends Religious Services: More than 4 times per year    Active Member of [employer] or Organizations: Yes    Attends Engineer, structural: More than 4 times per year    Marital Status: Married  Catering manager Violence: Not At Risk (09/24/2022)   Humiliation, Afraid, Rape, and Kick questionnaire    Fear of Current or Ex-Partner: No    Emotionally Abused: No    Physically Abused: No    Sexually Abused: No    Review of Systems: See HPI, otherwise negative ROS  Physical Exam: There were no vitals taken for this visit. General:   Alert,  pleasant and cooperative in NAD Head:  Normocephalic and atraumatic. Lungs:  Clear to auscultation.    Heart:  Regular rate and rhythm.   Impression/Plan: Jeremiah Beck is here for ophthalmic surgery.  Risks, benefits, limitations, and alternatives regarding ophthalmic surgery have been reviewed with the patient.  Questions have been answered.  All parties agreeable.   Annell Kidney, MD  10/19/2023, 9:39 AM

## 2023-10-19 NOTE — Op Note (Signed)
 LOCATION:  [facility]   PREOPERATIVE DIAGNOSIS:    Nuclear sclerotic cataract right eye. H25.11   POSTOPERATIVE DIAGNOSIS:  Nuclear sclerotic cataract right eye.     PROCEDURE:  Phacoemusification with posterior chamber intraocular lens placement of the right eye   ULTRASOUND TIME: Procedure(s): PHACOEMULSIFICATION, CATARACT, WITH IOL INSERTION 7.18 00:49.4 (Right)  LENS:   Implant Name Type Inv. Item Serial No. Manufacturer Lot No. LRB No. Used Action  LENS IOL TECNIS EYHANCE 11.5 - [id number] Intraocular Lens LENS IOL TECNIS EYHANCE 11.5 [id number] [employer]  Right 1 Implanted         SURGEON:  Berline Brenner, MD   ANESTHESIA:  Topical with tetracaine  drops and 2% Xylocaine  jelly, augmented with 1% preservative-free intracameral lidocaine .    COMPLICATIONS:  None.   DESCRIPTION OF PROCEDURE:  The patient was identified in the holding room and transported to the operating room and placed in the supine position under the operating microscope.  The right eye was identified as the operative eye and it was prepped and draped in the usual sterile ophthalmic fashion.   A 1 millimeter clear-corneal paracentesis was made at the 12:00 position.  0.5 ml of preservative-free 1% lidocaine  was injected into the anterior chamber. The anterior chamber was filled with Viscoat viscoelastic.  A 2.4 millimeter keratome was used to make a near-clear corneal incision at the 9:00 position.  A curvilinear capsulorrhexis was made with a cystotome and capsulorrhexis forceps.  Balanced salt  solution was used to hydrodissect and hydrodelineate the nucleus.   Phacoemulsification was then used in stop and chop fashion to remove the lens nucleus and epinucleus.  The remaining cortex was then removed using the irrigation and aspiration handpiece. Provisc was then placed into the capsular bag to distend it for lens placement.  A lens was then injected into the capsular bag.  The remaining  viscoelastic was aspirated.   Wounds were hydrated with balanced salt  solution.  The anterior chamber was inflated to a physiologic pressure with balanced salt  solution.  No wound leaks were noted. Cefuroxime  0.1 ml of a 10mg /ml solution was injected into the anterior chamber for a dose of 1 mg of intracameral antibiotic at the completion of the case.   Timolol  and Brimonidine  drops were applied to the eye.  The patient was taken to the recovery room in stable condition without complications of anesthesia or surgery.   Therisa Mennella 10/19/2023, 10:26 AM

## 2023-10-19 NOTE — Transfer of Care (Signed)
 Immediate Anesthesia Transfer of Care Note  Patient: Jeremiah Beck  Procedure(s) Performed: PHACOEMULSIFICATION, CATARACT, WITH IOL INSERTION 7.18 00:49.4 (Right: Eye)  Patient Location: PACU  Anesthesia Type: MAC  Level of Consciousness: awake, alert  and patient cooperative  Airway and Oxygen Therapy: Patient Spontanous Breathing and Patient connected to supplemental oxygen  Post-op Assessment: Post-op Vital signs reviewed, Patient's Cardiovascular Status Stable, Respiratory Function Stable, Patent Airway and No signs of Nausea or vomiting  Post-op Vital Signs: Reviewed and stable  Complications: No notable events documented.

## 2023-10-20 ENCOUNTER — Encounter: Payer: Self-pay | Admitting: Ophthalmology

## 2023-12-13 ENCOUNTER — Encounter: Payer: Self-pay | Admitting: Internal Medicine

## 2023-12-13 ENCOUNTER — Ambulatory Visit (INDEPENDENT_AMBULATORY_CARE_PROVIDER_SITE_OTHER): Admitting: Internal Medicine

## 2023-12-13 ENCOUNTER — Other Ambulatory Visit: Payer: Self-pay

## 2023-12-13 VITALS — BP 108/68 | HR 55 | Temp 98.1°F | Resp 16 | Ht 71.0 in | Wt 182.0 lb

## 2023-12-13 DIAGNOSIS — H6123 Impacted cerumen, bilateral: Secondary | ICD-10-CM | POA: Diagnosis not present

## 2023-12-13 DIAGNOSIS — R5382 Chronic fatigue, unspecified: Secondary | ICD-10-CM

## 2023-12-13 DIAGNOSIS — E559 Vitamin D deficiency, unspecified: Secondary | ICD-10-CM

## 2023-12-13 DIAGNOSIS — H6121 Impacted cerumen, right ear: Secondary | ICD-10-CM | POA: Diagnosis not present

## 2023-12-13 NOTE — Progress Notes (Signed)
 Acute Office Visit  Subjective:     Patient ID: Jeremiah Beck, male    DOB: 1951/04/23, 73 y.o.   MRN: [id number]  Chief Complaint  Patient presents with   Fatigue    HPI Patient is in today for fatigue.   Discussed the use of AI scribe software for clinical note transcription with the patient, who gave verbal consent to proceed.  History of Present Illness Jeremiah Beck is a 73 year old male who presents with persistent fatigue and concerns about vitamin deficiency.  He experiences persistent fatigue with a gradual onset and is concerned about potential vitamin deficiencies, particularly vitamin D . He takes a 'fifty plus' complete formula multivitamin. His sleep pattern is generally good, with six and a half to seven and a half hours of sleep per night. He wakes once at two or three o'clock in the morning to urinate but returns to sleep easily. He takes short naps regularly, a habit for over thirty years. No significant changes in sleep pattern or urination frequency are noted.  He experiences occasional dizziness, associated with congestion and a sensation of vertigo, particularly when congested. He has a history of ear wax buildup, which he believes might contribute to his symptoms. His blood pressure was recently low at 108/68, and he has felt dizzy or lightheaded at times, particularly after getting wet and cold. No persistent nasal congestion or sinus pressure is present at the time of the visit.      Review of Systems  Constitutional:  Positive for malaise/fatigue. Negative for chills and fever.  HENT:  Positive for congestion. Negative for ear pain, sinus pain and sore throat.         Objective:    BP 108/68 (Cuff Size: Large)   Pulse (!) 55   Temp 98.1 F (36.7 C) (Oral)   Resp 16   Ht 5' 11 (1.803 m)   Wt 182 lb (82.6 kg)   SpO2 97%   BMI 25.38 kg/m  BP Readings from Last 3 Encounters:  12/13/23 108/68  10/19/23 118/76  10/05/23 115/70   Wt Readings  from Last 3 Encounters:  12/13/23 182 lb (82.6 kg)  10/19/23 185 lb (83.9 kg)  10/05/23 184 lb (83.5 kg)      Physical Exam Constitutional:      Appearance: Normal appearance.  HENT:     Head: Normocephalic and atraumatic.     Right Ear: Ear canal and external ear normal. There is impacted cerumen.     Left Ear: Tympanic membrane, ear canal and external ear normal.  Eyes:     Conjunctiva/sclera: Conjunctivae normal.  Cardiovascular:     Rate and Rhythm: Normal rate and regular rhythm.  Pulmonary:     Effort: Pulmonary effort is normal.     Breath sounds: Normal breath sounds.  Skin:    General: Skin is warm and dry.  Neurological:     General: No focal deficit present.     Mental Status: He is alert. Mental status is at baseline.  Psychiatric:        Mood and Affect: Mood normal.        Behavior: Behavior normal.     No results found for any visits on 12/13/23.      Assessment & Plan:   Assessment & Plan Fatigue Chronic fatigue with stable sleep patterns. Differential includes anemia, thyroid dysfunction, vitamin D  deficiency, and vitamin B12 deficiency. - Order labs for anemia, iron deficiency, electrolytes, kidney function, thyroid function, vitamin D , and vitamin  B12 levels.  Cerumen impaction, right ear Significant cerumen impaction in the right ear, obstructing view of the eardrum. Potential contributor to vertigo-like symptoms. - Perform ear irrigation with warm water and peroxide. - Recommend over-the-counter Debrox drops.  Benign paroxysmal positional vertigo (suspected) Intermittent vertigo-like symptoms possibly related to inner ear issues and sinus congestion. - Recommend nasal saline rinse. - Consider over-the-counter Flonase  if symptoms persist.  Nocturia and intermittent slow urinary stream Intermittent slow urinary stream and nocturia. - Repeat PSA test in November during physical exam.  - CBC w/Diff/Platelet - Basic Metabolic Panel (BMET) -  Vitamin D  (25 hydroxy) - TSH - Vitamin B12 - Ear Lavage    Return for already scheduled.  Sharyle Fischer, DO

## 2023-12-14 ENCOUNTER — Ambulatory Visit: Payer: Self-pay | Admitting: Internal Medicine

## 2023-12-14 LAB — BASIC METABOLIC PANEL WITH GFR
BUN: 9 mg/dL (ref 7–25)
CO2: 30 mmol/L (ref 20–32)
Calcium: 9.3 mg/dL (ref 8.6–10.3)
Chloride: 105 mmol/L (ref 98–110)
Creat: 0.98 mg/dL (ref 0.70–1.28)
Glucose, Bld: 90 mg/dL (ref 65–99)
Potassium: 4.8 mmol/L (ref 3.5–5.3)
Sodium: 140 mmol/L (ref 135–146)
eGFR: 81 mL/min/1.73m2 (ref >=60)

## 2023-12-14 LAB — VITAMIN D 25 HYDROXY (VIT D DEFICIENCY, FRACTURES): Vit D, 25-Hydroxy: 52 ng/mL (ref 30–100)

## 2023-12-14 LAB — CBC WITH DIFFERENTIAL/PLATELET
Absolute Lymphocytes: 1463 {cells}/uL (ref 850–3900)
Absolute Monocytes: 761 {cells}/uL (ref 200–950)
Basophils Absolute: 59 {cells}/uL (ref 0–200)
Basophils Relative: 1.1 %
Eosinophils Absolute: 351 {cells}/uL (ref 15–500)
Eosinophils Relative: 6.5 %
HCT: 44.1 % (ref 38.5–50.0)
Hemoglobin: 15 g/dL (ref 13.2–17.1)
MCH: 33 pg (ref 27.0–33.0)
MCHC: 34 g/dL (ref 32.0–36.0)
MCV: 97.1 fL (ref 80.0–100.0)
MPV: 11.8 fL (ref 7.5–12.5)
Monocytes Relative: 14.1 %
Neutro Abs: 2765 {cells}/uL (ref 1500–7800)
Neutrophils Relative %: 51.2 %
Platelets: 259 Thousand/uL (ref 140–400)
RBC: 4.54 Million/uL (ref 4.20–5.80)
RDW: 12.5 % (ref 11.0–15.0)
Total Lymphocyte: 27.1 %
WBC: 5.4 Thousand/uL (ref 3.8–10.8)

## 2023-12-14 LAB — VITAMIN B12: Vitamin B-12: 369 pg/mL (ref 200–1100)

## 2023-12-14 LAB — TSH: TSH: 0.8 m[IU]/L (ref 0.40–4.50)

## 2024-02-01 ENCOUNTER — Ambulatory Visit: Payer: Medicare HMO | Admitting: Dermatology

## 2024-02-01 DIAGNOSIS — L570 Actinic keratosis: Secondary | ICD-10-CM | POA: Diagnosis not present

## 2024-02-01 DIAGNOSIS — W908XXA Exposure to other nonionizing radiation, initial encounter: Secondary | ICD-10-CM

## 2024-02-01 DIAGNOSIS — Z5111 Encounter for antineoplastic chemotherapy: Secondary | ICD-10-CM

## 2024-02-01 DIAGNOSIS — L814 Other melanin hyperpigmentation: Secondary | ICD-10-CM

## 2024-02-01 DIAGNOSIS — L821 Other seborrheic keratosis: Secondary | ICD-10-CM | POA: Diagnosis not present

## 2024-02-01 DIAGNOSIS — Z1283 Encounter for screening for malignant neoplasm of skin: Secondary | ICD-10-CM | POA: Diagnosis not present

## 2024-02-01 DIAGNOSIS — L578 Other skin changes due to chronic exposure to nonionizing radiation: Secondary | ICD-10-CM

## 2024-02-01 DIAGNOSIS — Z7189 Other specified counseling: Secondary | ICD-10-CM

## 2024-02-01 DIAGNOSIS — D229 Melanocytic nevi, unspecified: Secondary | ICD-10-CM

## 2024-02-01 DIAGNOSIS — D1801 Hemangioma of skin and subcutaneous tissue: Secondary | ICD-10-CM

## 2024-02-01 DIAGNOSIS — Z79899 Other long term (current) drug therapy: Secondary | ICD-10-CM

## 2024-02-01 NOTE — Patient Instructions (Addendum)
 Photodynamic Therapy- Blue or Red Light Therapy  Actinic keratoses are the dry, red scaly spots on the skin caused by sun damage. A portion of these spots can turn into skin cancer with time, and treating them can help prevent development of skin cancer.   Treatment of these spots requires removal of the defective skin cells. There are various ways to remove actinic keratoses, including freezing with liquid nitrogen, treatment with creams, or treatment with a blue light procedure in the office.   Photodynamic Therapy (PDT), also known as blue or red light therapy is an in office procedure used to treat actinic keratoses. It works by targeting precancerous cells. After treatment, these cells peel off and are replaced by healthy ones.   For your phototherapy appointment, you will have two appointments on the day of your treatment. The first appointment will be to apply a cream to the treatment area. You will leave this cream on for 1-2 hours depending on the area being treated. The second appointment will be to shine a blue or red light on the area for 16-20 minutes to kill off the precancer cells. It is common to experience a burning sensation during the treatment.  After your treatment, it will be important to keep the treated areas of skin out of the sun completely for 48-72 hours (2-3 days) to prevent having a reaction.   Common side effects include: - Burning or stinging, which may be severe and can last up to 24-72 hours after your treatment - Scaling and crusting which may last up to 2 weeks - Redness, swelling and/or peeling which can last up to 4 weeks  To Care for Your Skin After PDT/Blue/Red Light Therapy: - Wash with soap, water and shampoo as normal. - If needed, you can use cold compresses (e.g. ice packs) for comfort - If okay with your primary care doctor, you may use analgesics such as acetaminophen  (tylenol ) every 4-6 hours, not to exceed recommended dose - You may  apply Cerave Healing Ointment, Vaseline or Aquaphor as needed - If you have a lot of swelling you may take a Benadryl to help with this (this may cause drowsiness), not to exceed recommended dose. This may increase the risk of falls in people over 65 and may slow reaction time while driving, so it is not recommended to take before driving or operating machinery. - Sun Precautions - Wear a wide brim hat for the next week if outside  - Wear a sunblock with zinc or titanium dioxide at least SPF 50 daily  If you have any questions or concerns, please call the office and ask to speak with a nurse.   --------------------------------------------------------------------------------------------------------------       Actinic keratoses are precancerous spots that appear secondary to cumulative UV radiation exposure/sun exposure over time. They are chronic with expected duration over 1 year. A portion of actinic keratoses will progress to squamous cell carcinoma of the skin. It is not possible to reliably predict which spots will progress to skin cancer and so treatment is recommended to prevent development of skin cancer.  Recommend daily broad spectrum sunscreen SPF 30+ to sun-exposed areas, reapply every 2 hours as needed.  Recommend staying in the shade or wearing long sleeves, sun glasses (UVA+UVB protection) and wide brim hats (4-inch brim around the entire circumference of the hat). Call for new or changing lesions.    Cryotherapy Aftercare  Wash gently with soap and water everyday.   Apply Vaseline and Band-Aid  daily until healed.      Seborrheic Keratosis  What causes seborrheic keratoses? Seborrheic keratoses are harmless, common skin growths that first appear during adult life.  As time goes by, more growths appear.  Some people may develop a large number of them.  Seborrheic keratoses appear on both covered and uncovered body parts.  They are not caused by sunlight.  The tendency to  develop seborrheic keratoses can be inherited.  They vary in color from skin-colored to gray, brown, or even black.  They can be either smooth or have a rough, warty surface.   Seborrheic keratoses are superficial and look as if they were stuck on the skin.  Under the microscope this type of keratosis looks like layers upon layers of skin.  That is why at times the top layer may seem to fall off, but the rest of the growth remains and re-grows.    Treatment Seborrheic keratoses do not need to be treated, but can easily be removed in the office.  Seborrheic keratoses often cause symptoms when they rub on clothing or jewelry.  Lesions can be in the way of shaving.  If they become inflamed, they can cause itching, soreness, or burning.  Removal of a seborrheic keratosis can be accomplished by freezing, burning, or surgery. If any spot bleeds, scabs, or grows rapidly, please return to have it checked, as these can be an indication of a skin cancer.   Melanoma ABCDEs  Melanoma is the most dangerous type of skin cancer, and is the leading cause of death from skin disease.  You are more likely to develop melanoma if you: Have light-colored skin, light-colored eyes, or red or blond hair Spend a lot of time in the sun Tan regularly, either outdoors or in a tanning bed Have had blistering sunburns, especially during childhood Have a close family member who has had a melanoma Have atypical moles or large birthmarks  Early detection of melanoma is key since treatment is typically straightforward and cure rates are extremely high if we catch it early.   The first sign of melanoma is often a change in a mole or a new dark spot.  The ABCDE system is a way of remembering the signs of melanoma.  A for asymmetry:  The two halves do not match. B for border:  The edges of the growth are irregular. C for color:  A mixture of colors are present instead of an even brown color. D for diameter:  Melanomas are  usually (but not always) greater than 6mm - the size of a pencil eraser. E for evolution:  The spot keeps changing in size, shape, and color.  Please check your skin once per month between visits. You can use a small mirror in front and a large mirror behind you to keep an eye on the back side or your body.   If you see any new or changing lesions before your next follow-up, please call to schedule a visit.  Please continue daily skin protection including broad spectrum sunscreen SPF 30+ to sun-exposed areas, reapplying every 2 hours as needed when you're outdoors.   Staying in the shade or wearing long sleeves, sun glasses (UVA+UVB protection) and wide brim hats (4-inch brim around the entire circumference of the hat) are also recommended for sun protection.    Due to recent changes in healthcare laws, you may see results of your pathology and/or laboratory studies on MyChart before the doctors have had a chance to review  them. We understand that in some cases there may be results that are confusing or concerning to you. Please understand that not all results are received at the same time and often the doctors may need to interpret multiple results in order to provide you with the best plan of care or course of treatment. Therefore, we ask that you please give us  2 business days to thoroughly review all your results before contacting the office for clarification. Should we see a critical lab result, you will be contacted sooner.   If You Need Anything After Your Visit  If you have any questions or concerns for your doctor, please call our main line at [phone] and press option 4 to reach your doctor's medical assistant. If no one answers, please leave a voicemail as directed and we will return your call as soon as possible. Messages left after 4 pm will be answered the following business day.   You may also send us  a message via MyChart. We typically respond to MyChart messages within 1-2  business days.  For prescription refills, please ask your pharmacy to contact our office. Our fax number is [phone].  If you have an urgent issue when the clinic is closed that cannot wait until the next business day, you can page your doctor at the number below.    Please note that while we do our best to be available for urgent issues outside of office hours, we are not available 24/7.   If you have an urgent issue and are unable to reach us , you may choose to seek medical care at your doctor's office, retail clinic, urgent care center, or emergency room.  If you have a medical emergency, please immediately call 911 or go to the emergency department.  Pager Numbers  - Dr. Hester: [phone]  - Dr. Jackquline: [phone]  - Dr. Claudene: [phone]   - Dr. Raymund: [phone]  In the event of inclement weather, please call our main line at [phone] for an update on the status of any delays or closures.  Dermatology Medication Tips: Please keep the boxes that topical medications come in in order to help keep track of the instructions about where and how to use these. Pharmacies typically print the medication instructions only on the boxes and not directly on the medication tubes.   If your medication is too expensive, please contact our office at [phone] option 4 or send us  a message through MyChart.   We are unable to tell what your co-pay for medications will be in advance as this is different depending on your insurance coverage. However, we may be able to find a substitute medication at lower cost or fill out paperwork to get insurance to cover a needed medication.   If a prior authorization is required to get your medication covered by your insurance company, please allow us  1-2 business days to complete this process.  Drug prices often vary depending on where the prescription is filled and some pharmacies may offer cheaper prices.  The website www.goodrx.com  contains coupons for medications through different pharmacies. The prices here do not account for what the cost may be with help from insurance (it may be cheaper with your insurance), but the website can give you the price if you did not use any insurance.  - You can print the associated coupon and take it with your prescription to the pharmacy.  - You may also stop by our office during regular business hours and pick up  a GoodRx coupon card.  - If you need your prescription sent electronically to a different pharmacy, notify our office through [facility] or by phone at [phone] option 4.     Si Usted Necesita Algo Despus de Su Visita  Tambin puede enviarnos un mensaje a travs de Clinical cytogeneticist. Por lo general respondemos a los mensajes de MyChart en el transcurso de 1 a 2 das hbiles.  Para renovar recetas, por favor pida a su farmacia que se ponga en contacto con nuestra oficina. Randi lakes de fax es [place] [phone].  Si tiene un asunto urgente cuando la clnica est cerrada y que no puede esperar hasta el siguiente da hbil, puede llamar/localizar a su doctor(a) al nmero que aparece a continuacin.   Por favor, tenga en cuenta que aunque hacemos todo lo posible para estar disponibles para asuntos urgentes fuera del horario de [place], no estamos disponibles las 24 horas del da, los 7 [address]  [address] de la [place].   Si tiene un problema urgente y no puede comunicarse con nosotros, puede optar por buscar atencin mdica  en el consultorio de su doctor(a), en una clnica privada, en un centro de atencin urgente o en una sala de emergencias.  Si tiene Engineer, drilling, por favor llame inmediatamente al 911 o vaya a la sala de emergencias.  Nmeros de bper  - Dr. Hester: [phone]  - Dra. Jackquline: [phone]  - Dr. Claudene: [phone]  - Dra. Kitts: [phone]  En caso de inclemencias del [place], por favor llame a nuestra lnea principal al [phone] para una  actualizacin sobre el estado de cualquier retraso o cierre.  Consejos para la medicacin en dermatologa: Por favor, guarde las cajas en las que vienen los medicamentos de uso tpico para ayudarle a seguir las instrucciones sobre dnde y cmo usarlos. Las farmacias generalmente imprimen las instrucciones del medicamento slo en las cajas y no directamente en los tubos del [place].   Si su medicamento es muy caro, por favor, pngase en contacto con landry rieger llamando al [phone] y presione la opcin 4 o envenos un mensaje a travs de Clinical cytogeneticist.   No podemos decirle cul ser su copago por los medicamentos por adelantado ya que esto es diferente dependiendo de la cobertura de su seguro. Sin embargo, es posible que podamos encontrar un medicamento sustituto a Audiological scientist un formulario para que el seguro cubra el medicamento que se considera necesario.   Si se requiere una autorizacin previa para que su compaa de seguros malta su medicamento, por favor permtanos de 1 a 2 das hbiles para completar este proceso.  Los precios de los medicamentos varan con frecuencia dependiendo del Environmental consultant de dnde se surte la receta y alguna farmacias pueden ofrecer precios ms baratos.  El sitio web www.goodrx.com tiene cupones para medicamentos de Health and safety inspector. Los precios aqu no tienen en cuenta lo que podra costar con la ayuda del seguro (puede ser ms barato con su seguro), pero el sitio web puede darle el precio si no utiliz Tourist information centre manager.  - Puede imprimir el cupn correspondiente y llevarlo con su receta a la farmacia.  - Tambin puede pasar por nuestra oficina durante el horario de atencin regular y Education officer, museum una tarjeta de cupones de GoodRx.  - Si necesita que su receta se enve electrnicamente a una farmacia diferente, informe a nuestra oficina a travs de MyChart de [place] o por telfono llamando al [phone] y presione la opcin 4.

## 2024-02-01 NOTE — Progress Notes (Unsigned)
 New Patient Visit   Subjective  Jeremiah Beck is a 73 y.o. male who presents for the following: Skin Cancer Screening and Full Body Skin Exam   Patient reports some history of skin cancer  Reports he was seeing Dr. Arlyss  Denies family history of skin cancer   Patient reports a spot at right side jaw that gets irritated when he shaves   The patient presents for Total-Body Skin Exam (TBSE) for skin cancer screening and mole check. The patient has spots, moles and lesions to be evaluated, some may be new or changing and the patient may have concern these could be cancer.  The following portions of the chart were reviewed this encounter and updated as appropriate: medications, allergies, medical history  Review of Systems:  No other skin or systemic complaints except as noted in HPI or Assessment and Plan.  Objective  Well appearing patient in no apparent distress; mood and affect are within normal limits.  A full examination was performed including scalp, head, eyes, ears, nose, lips, neck, chest, axillae, abdomen, back, buttocks, bilateral upper extremities, bilateral lower extremities, hands, feet, fingers, toes, fingernails, and toenails. All findings within normal limits unless otherwise noted below.   Relevant physical exam findings are noted in the Assessment and Plan.  face and ears x 16 (16) Erythematous thin papules/macules with gritty scale.   Assessment & Plan   SKIN CANCER SCREENING PERFORMED TODAY.   LENTIGINES, SEBORRHEIC KERATOSES, HEMANGIOMAS - Benign normal skin lesions - Benign-appearing - Call for any changes  MELANOCYTIC NEVI - Tan-brown and/or pink-flesh-colored symmetric macules and papules - Benign appearing on exam today - Observation - Call clinic for new or changing moles - Recommend daily use of broad spectrum spf 30+ sunscreen to sun-exposed areas.    ACTINIC KERATOSIS (16) face and ears x 16 (16) Discussed  November 1  - Start  5-fluorouracil/calcipotriene cream twice a day for a 7  days to affected areas including forehead and temples . Prescription sent to [facility]. Patient advised they will receive an email to purchase the medication online and have it sent to their home. Patient provided with handout reviewing treatment course and side effects and advised to call or message us  on MyChart with any concerns.  Reviewed course of treatment and expected reaction.  Patient advised to expect inflammation and crusting and advised that erosions are possible.  Patient advised to be diligent with sun protection during and after treatment. Counseled to keep medication out of reach of children and pets.  Or discussed Red Light treatment with debridement to forehead, temples and cheeks  in November   Patient prefers Red light treatment   ACTINIC DAMAGE WITH PRECANCEROUS ACTINIC KERATOSES Counseling for Topical Chemotherapy Management: Patient exhibits: - Severe, confluent actinic changes with pre-cancerous actinic keratoses that is secondary to cumulative UV radiation exposure over time - Condition that is severe; chronic, not at goal. - diffuse scaly erythematous macules and papules with underlying dyspigmentation - Discussed Prescription Field Treatment topical Chemotherapy for Severe, Chronic Confluent Actinic Changes with Pre-Cancerous Actinic Keratoses Field treatment involves treatment of an entire area of skin that has confluent Actinic Changes (Sun/ Ultraviolet light damage) and PreCancerous Actinic Keratoses by method of PhotoDynamic Therapy (PDT) and/or prescription Topical Chemotherapy agents such as 5-fluorouracil, 5-fluorouracil/calcipotriene, and/or imiquimod.  The purpose is to decrease the number of clinically evident and subclinical PreCancerous lesions to prevent progression to development of skin cancer by chemically destroying early precancer changes that may or  may not be visible.  It  has been shown to reduce the risk of developing skin cancer in the treated area. As a result of treatment, redness, scaling, crusting, and open sores may occur during treatment course. One or more than one of these methods may be used and may have to be used several times to control, suppress and eliminate the PreCancerous changes. Discussed treatment course, expected reaction, and possible side effects. - Recommend daily broad spectrum sunscreen SPF 30+ to sun-exposed areas, reapply every 2 hours as needed.  - Staying in the shade or wearing long sleeves, sun glasses (UVA+UVB protection) and wide brim hats (4-inch brim around the entire circumference of the hat) are also recommended. - Call for new or changing lesions.  Destruction of lesion - face and ears x 16 (16) Complexity: simple   Destruction method: cryotherapy   Informed consent: discussed and consent obtained   Timeout:  patient name, date of birth, surgical site, and procedure verified Lesion destroyed using liquid nitrogen: Yes   Region frozen until ice ball extended beyond lesion: Yes   Outcome: patient tolerated procedure well with no complications   Post-procedure details: wound care instructions given     Return in about 1 year (around 01/31/2025) for TBSE.  IEleanor Blush, CMA, am acting as scribe for Alm Rhyme, MD.   Documentation: I have reviewed the above documentation for accuracy and completeness, and I agree with the above.  Alm Rhyme, MD

## 2024-02-02 ENCOUNTER — Encounter: Payer: Self-pay | Admitting: Dermatology

## 2024-02-06 ENCOUNTER — Telehealth: Payer: Self-pay

## 2024-02-06 NOTE — Telephone Encounter (Signed)
 Medical records from [facility] in media for review. aw

## 2024-03-02 ENCOUNTER — Ambulatory Visit

## 2024-03-02 VITALS — BP 108/68 | Ht 71.0 in | Wt 185.0 lb

## 2024-03-02 DIAGNOSIS — Z0000 Encounter for general adult medical examination without abnormal findings: Secondary | ICD-10-CM

## 2024-03-02 NOTE — Patient Instructions (Signed)
 Mr. Jeremiah Beck,  Thank you for taking the time for your Medicare Wellness Visit. I appreciate your continued commitment to your health goals. Please review the care plan we discussed, and feel free to reach out if I can assist you further.  Medicare recommends these wellness visits once per year to help you and your care team stay ahead of potential health issues. These visits are designed to focus on prevention, allowing your provider to concentrate on managing your acute and chronic conditions during your regular appointments.  Please note that Annual Wellness Visits do not include a physical exam. Some assessments may be limited, especially if the visit was conducted virtually. If needed, we may recommend a separate in-person follow-up with your provider.  Ongoing Care Seeing your primary care provider every 3 to 6 months helps us  monitor your health and provide consistent, personalized care.   Referrals If a referral was made during today's visit and you haven't received any updates within two weeks, please contact the referred provider directly to check on the status.  Recommended Screenings:  Health Maintenance  Topic Date Due   Zoster (Shingles) Vaccine (1 of 2) Never done   Flu Shot  12/02/2023   Cologuard (Stool DNA test)  07/15/2024   Medicare Annual Wellness Visit  03/02/2025   DTaP/Tdap/Td vaccine (4 - Td or Tdap) 04/29/2031   Pneumococcal Vaccine for age over 82  Completed   Hepatitis C Screening  Completed   Meningitis B Vaccine  Aged Out   COVID-19 Vaccine  Discontinued       03/02/2024   10:25 AM  Advanced Directives  Does Patient Have a Medical Advance Directive? No  Would patient like information on creating a medical advance directive? No - Patient declined   Advance Care Planning is important because it: Ensures you receive medical care that aligns with your values, goals, and preferences. Provides guidance to your family and loved ones, reducing the emotional  burden of decision-making during critical moments.  Vision: Annual vision screenings are recommended for early detection of glaucoma, cataracts, and diabetic retinopathy. These exams can also reveal signs of chronic conditions such as diabetes and high blood pressure.  Dental: Annual dental screenings help detect early signs of oral cancer, gum disease, and other conditions linked to overall health, including heart disease and diabetes.  Please see the attached documents for additional preventive care recommendations.

## 2024-03-02 NOTE — Progress Notes (Signed)
 Because this visit was a virtual/telehealth visit,  certain criteria was not obtained, such a blood pressure, CBG if applicable, and timed get up and go. Any medications not marked as taking were not mentioned during the medication reconciliation part of the visit. Any vitals not documented were not able to be obtained due to this being a telehealth visit or patient was unable to self-report a recent blood pressure reading due to a lack of equipment at home via telehealth. Vitals that have been documented are verbally provided by the patient.  This visit was performed by a medical professional under my direct supervision. I was immediately available for consultation/collaboration. I have reviewed and agree with the Annual Wellness Visit documentation.  Subjective:   Jeremiah Beck is a 73 y.o. who presents for a Medicare Wellness preventive visit.  As a reminder, Annual Wellness Visits don't include a physical exam, and some assessments may be limited, especially if this visit is performed virtually. We may recommend an in-person follow-up visit with your provider if needed.  Visit Complete: Virtual I connected with  Jafari Bohall on 03/02/24 by a audio enabled telemedicine application and verified that I am speaking with the correct person using two identifiers.  Patient Location: Home  Provider Location: Home Office  I discussed the limitations of evaluation and management by telemedicine. The patient expressed understanding and agreed to proceed.  Vital Signs: Because this visit was a virtual/telehealth visit, some criteria may be missing or patient reported. Any vitals not documented were not able to be obtained and vitals that have been documented are patient reported.  VideoDeclined- This patient declined Librarian, academic. Therefore the visit was completed with audio only.  Persons Participating in Visit: Patient.  AWV Questionnaire: No: Patient Medicare  AWV questionnaire was not completed prior to this visit.  Cardiac Risk Factors include: advanced age (>74men, >4 women);male gender;dyslipidemia     Objective:    Today's Vitals   03/02/24 1022  BP: 108/68  Weight: 185 lb (83.9 kg)  Height: 5' 11 (1.803 m)   Body mass index is 25.8 kg/m.     03/02/2024   10:25 AM 10/19/2023    9:39 AM 10/05/2023   11:51 AM 09/24/2022    9:49 AM 01/11/2022    3:45 PM 01/05/2022   12:26 PM 10/15/2021    2:55 PM  Advanced Directives  Does Patient Have a Medical Advance Directive? No No No Yes No No No  Type of Theme Park Manager;Living will     Copy of Healthcare Power of Attorney in Chart?    No - copy requested     Would patient like information on creating a medical advance directive? No - Patient declined No - Patient declined No - Patient declined  No - Patient declined No - Patient declined No - Patient declined    Current Medications (verified) Outpatient Encounter Medications as of 03/02/2024  Medication Sig   acetaminophen  (TYLENOL ) 500 MG tablet Take 2 tablets (1,000 mg total) by mouth every 6 (six) hours as needed for mild pain.   Apple Cider Vinegar 300 MG TABS Take by mouth.   finasteride  (PROSCAR ) 5 MG tablet TAKE 1 TABLET BY MOUTH ONCE DAILY   Multiple Vitamins-Minerals (ONE-A-DAY MENS 50+ ADVANTAGE) TABS Take by mouth.   Omega-3 Fatty Acids (FISH OIL) 1000 MG CAPS Take by mouth.   Turmeric 500 MG CAPS Take by mouth.   Garlic 100 MG TABS Take by mouth. (  Patient not taking: Reported on 03/02/2024)   No facility-administered encounter medications on file as of 03/02/2024.    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 Arlyss office   History of SCC (squamous cell carcinoma) of skin 2014   Left superior medial malar cheek - excision 2014 - seen by Dr Arlyss office   History of SCC (squamous cell carcinoma) of skin 2018   Right cheek buccal - excision - 2018 - seen by Dr.Graham office   History of SCC (squamous cell carcinoma) of skin 2021   right cheek buccal area excision - 2021 seen by Dr Sheri office   History of SCC (squamous cell carcinoma) of skin 2024   scalp and left temple excision 2024 seen by Dr. Sheri office   Past Surgical History:  Procedure Laterality Date   CATARACT EXTRACTION W/PHACO Left 10/05/2023   Procedure: PHACOEMULSIFICATION, CATARACT, WITH IOL INSERTION 10.86 01:05.7;  Surgeon: Mittie Gaskin, MD;  Location: [facility];  Service: Ophthalmology;  Laterality: Left;   CATARACT EXTRACTION W/PHACO Right 10/19/2023   Procedure: PHACOEMULSIFICATION, CATARACT, WITH IOL INSERTION 7.18 00:49.4;  Surgeon: Mittie Gaskin, MD;  Location: [facility];  Service: Ophthalmology;  Laterality: Right;   HERNIA REPAIR Left    inguinal   INSERTION OF MESH  01/14/2022   Procedure: INSERTION OF MESH;  Surgeon: Desiderio Schanz, MD;  Location: [facility];  Service: General;;   SKIN CANCER EXCISION     removed from face   UMBILICAL HERNIA REPAIR N/A 01/14/2022   Procedure: HERNIA REPAIR UMBILICAL ADULT, open;  Surgeon: Desiderio Schanz, MD;  Location: [facility];  Service: General;  Laterality: N/A;   Family History  Problem Relation Age of Onset   Cancer Father    Alcohol abuse Father    Cancer Mother    Varicose Veins Paternal Grandmother    Social History   Socioeconomic History   Marital status: Divorced    Spouse name: Not on file   Number of children: 0   Years of education: Not on file   Highest education level: Some college, no degree  Occupational History   Occupation: retired  Tobacco Use    Smoking status: Never   Smokeless tobacco: Never  Vaping Use   Vaping status: Never Used  Substance and Sexual Activity   Alcohol use: No    Comment: Rare   Drug use: No   Sexual activity: Not Currently  Other Topics Concern   Not on file  Social History Narrative   Not on file   Social Drivers of Health   Financial Resource Strain: Low Risk  (03/02/2024)   Overall Financial Resource Strain (CARDIA)    Difficulty of Paying Living Expenses: Not hard at all  Food Insecurity: No Food Insecurity (03/02/2024)   Hunger Vital Sign    Worried About Running Out of Food in the Last Year: Never true    Ran Out of Food in the Last Year: Never true  Transportation Needs: No Transportation Needs (03/02/2024)   PRAPARE - Transportation    Lack of Transportation (Medical): No    Lack  of Transportation (Non-Medical): No  Physical Activity: Sufficiently Active (03/02/2024)   Exercise Vital Sign    Days of Exercise per Week: 7 days    Minutes of Exercise per Session: 30 min  Stress: No Stress Concern Present (03/02/2024)   [employer] of Occupational Health - Occupational Stress Questionnaire    Feeling of Stress: Not at all  Social Connections: Moderately Isolated (03/02/2024)   Social Connection and Isolation Panel    Frequency of Communication with Friends and Family: More than three times a week    Frequency of Social Gatherings with Friends and Family: More than three times a week    Attends Religious Services: More than 4 times per year    Active Member of [employer] or Organizations: No    Attends Engineer, Structural: Never    Marital Status: Divorced    Tobacco Counseling Counseling given: Not Answered    Clinical Intake:  Pre-visit preparation completed: Yes  Pain : No/denies pain     BMI - recorded: 25.8 Nutritional Status: BMI 25 -29 Overweight Nutritional Risks: None Diabetes: No  No results found for: HGBA1C   How often do you need to have  someone help you when you read instructions, pamphlets, or other written materials from your doctor or pharmacy?: 1 - Never  Interpreter Needed?: No  Information entered by :: Bartley Vuolo,CMA   Activities of Daily Living     03/02/2024   10:24 AM 10/19/2023    9:45 AM  In your present state of health, do you have any difficulty performing the following activities:  Hearing? 0 0  Vision? 0 0  Difficulty concentrating or making decisions? 0 0  Walking or climbing stairs? 0   Dressing or bathing? 0   Doing errands, shopping? 0   Preparing Food and eating ? N   Using the Toilet? N   In the past six months, have you accidently leaked urine? N   Do you have problems with loss of bowel control? N   Managing your Medications? N   Managing your Finances? N   Housekeeping or managing your Housekeeping? N     Patient Care Team: Bernardo Fend, DO as PCP - General (Internal Medicine)  I have updated your Care Teams any recent Medical Services you may have received from other providers in the past year.     Assessment:   This is a routine wellness examination for Berdell.  Hearing/Vision screen Hearing Screening - Comments:: No difficulties Vision Screening - Comments:: Has had eye surgery    Goals Addressed             This Visit's Progress    Patient Stated       Improving things around home       Depression Screen     03/02/2024   10:26 AM 12/13/2023    8:09 AM 03/22/2023    3:47 PM 09/24/2022    9:45 AM 02/09/2022    1:05 PM 02/09/2022    1:02 PM 10/15/2021    2:54 PM  PHQ 2/9 Scores  PHQ - 2 Score 0 0 0 0 0 0 0  PHQ- 9 Score 0  0  0 0     Fall Risk     03/02/2024   10:25 AM 12/13/2023    8:09 AM 03/22/2023    3:47 PM 09/24/2022    9:51 AM 02/11/2022    2:13 PM  Fall Risk   Falls in the past year? 0 0  0 0 0  Number falls in past yr: 0 0 0 0   Injury with Fall? 0 0 0 0   Risk for fall due to : No Fall Risks No Fall Risks  No Fall Risks    Follow up Falls evaluation completed Falls evaluation completed  Falls prevention discussed;Falls evaluation completed     MEDICARE RISK AT HOME:  Medicare Risk at Home Any stairs in or around the home?: No If so, are there any without handrails?: No Home free of loose throw rugs in walkways, pet beds, electrical cords, etc?: Yes Adequate lighting in your home to reduce risk of falls?: Yes Life alert?: No Use of a cane, walker or w/c?: No Grab bars in the bathroom?: Yes Shower chair or bench in shower?: Yes Elevated toilet seat or a handicapped toilet?: Yes  TIMED UP AND GO:  Was the test performed?  No  Cognitive Function: 6CIT completed        03/02/2024   10:23 AM 09/24/2022    9:46 AM  6CIT Screen  What Year? 0 points 0 points  What month? 0 points 0 points  What time? 0 points 0 points  Count back from 20 0 points 0 points  Months in reverse 0 points 0 points  Repeat phrase 0 points 0 points  Total Score 0 points 0 points    Immunizations Immunization History  Administered Date(s) Administered   INFLUENZA, HIGH DOSE SEASONAL PF 02/22/2017   Influenza,inj,Quad PF,6+ Mos 02/01/2019   Pneumococcal Conjugate-13 02/22/2017   Pneumococcal Polysaccharide-23 05/01/2019   Td 01/01/2002, 05/03/2010   Tdap 04/28/2021    Screening Tests Health Maintenance  Topic Date Due   Zoster Vaccines- Shingrix (1 of 2) Never done   Influenza Vaccine  12/02/2023   Fecal DNA (Cologuard)  07/15/2024   Medicare Annual Wellness (AWV)  03/02/2025   DTaP/Tdap/Td (4 - Td or Tdap) 04/29/2031   Pneumococcal Vaccine: 50+ Years  Completed   Hepatitis C Screening  Completed   Meningococcal B Vaccine  Aged Out   COVID-19 Vaccine  Discontinued    Health Maintenance Items Addressed:patient declined   Additional Screening:  Vision Screening: Recommended annual ophthalmology exams for early detection of glaucoma and other disorders of the eye. Is the patient up to date with their annual  eye exam?  No    Dental Screening: Recommended annual dental exams for proper oral hygiene  Community Resource Referral / Chronic Care Management: CRR required this visit?  No   CCM required this visit?  No   Plan:    I have personally reviewed and noted the following in the patient's chart:   Medical and social history Use of alcohol, tobacco or illicit drugs  Current medications and supplements including opioid prescriptions. Patient is not currently taking opioid prescriptions. Functional ability and status Nutritional status Physical activity Advanced directives List of other physicians Hospitalizations, surgeries, and ER visits in previous 12 months Vitals Screenings to include cognitive, depression, and falls Referrals and appointments  In addition, I have reviewed and discussed with patient certain preventive protocols, quality metrics, and best practice recommendations. A written personalized care plan for preventive services as well as general preventive health recommendations were provided to patient.   Lyle MARLA Right, [place]   03/02/2024   After Visit Summary: (MyChart) Due to this being a telephonic visit, the after visit summary with patients personalized plan was offered to patient via MyChart   Notes: Nothing significant to report at this time.

## 2024-03-12 ENCOUNTER — Encounter

## 2024-03-12 ENCOUNTER — Ambulatory Visit

## 2024-03-23 ENCOUNTER — Encounter: Payer: Self-pay | Admitting: Internal Medicine

## 2024-04-18 ENCOUNTER — Encounter: Payer: Self-pay | Admitting: Internal Medicine

## 2024-04-18 ENCOUNTER — Ambulatory Visit: Admitting: Internal Medicine

## 2024-04-18 ENCOUNTER — Other Ambulatory Visit: Payer: Self-pay

## 2024-04-18 VITALS — BP 120/74 | HR 54 | Temp 97.7°F | Resp 16 | Ht 71.0 in | Wt 183.3 lb

## 2024-04-18 DIAGNOSIS — Z125 Encounter for screening for malignant neoplasm of prostate: Secondary | ICD-10-CM | POA: Diagnosis not present

## 2024-04-18 DIAGNOSIS — G629 Polyneuropathy, unspecified: Secondary | ICD-10-CM | POA: Diagnosis not present

## 2024-04-18 DIAGNOSIS — R39198 Other difficulties with micturition: Secondary | ICD-10-CM

## 2024-04-18 DIAGNOSIS — Z0000 Encounter for general adult medical examination without abnormal findings: Secondary | ICD-10-CM | POA: Diagnosis not present

## 2024-04-18 DIAGNOSIS — Z1211 Encounter for screening for malignant neoplasm of colon: Secondary | ICD-10-CM

## 2024-04-18 DIAGNOSIS — Z13220 Encounter for screening for lipoid disorders: Secondary | ICD-10-CM | POA: Diagnosis not present

## 2024-04-18 DIAGNOSIS — N401 Enlarged prostate with lower urinary tract symptoms: Secondary | ICD-10-CM | POA: Diagnosis not present

## 2024-04-18 MED ORDER — FINASTERIDE 5 MG PO TABS: 5.0000 mg | ORAL_TABLET | Freq: Every day | ORAL | 3 refills | Status: AC

## 2024-04-18 NOTE — Progress Notes (Signed)
 Name: Jeremiah Beck   MRN: [id number]    DOB: 25-Feb-1951   Date:04/18/2024       Progress Note  Subjective  Chief Complaint  Chief Complaint  Patient presents with   Annual Exam    HPI  Patient presents for annual CPE.  Discussed the use of AI scribe software for clinical note transcription with the patient, who gave verbal consent to proceed.  History of Present Illness Jeremiah Beck is a 73 year old male who presents for an annual physical exam and evaluation of urinary symptoms.  He has variable urinary stream with intermittent slowing. He denies nocturia and can delay urination. He drinks large amounts of fluids, especially tea. He has taken finasteride  5 mg daily for about 20 years without other prostate or urinary medications.  He has intermittent stabbing, tingling, and occasional numbness in his feet that feels like needles. Symptoms improve with scratching. He is not taking medication for these symptoms.  He has had Mohs surgery on his face for skin disease. He is hesitant to use topical chemotherapy cream due to concern about effects on normal skin.  He currently walks [place] a nearby [place] area and is interested in starting cycling to improve cardiovascular fitness.  He has not had a flu vaccine since 2020. He prefers natural remedies such as honey and lemon juice and is cautious about using medications and vaccines.    Diet: Regular Exercise: 7 days 30 minutes Last Dental Exam: completed Last Eye Exam: completed  Depression: phq 9 is negative    04/18/2024   10:10 AM 04/18/2024   10:06 AM 03/02/2024   10:26 AM 12/13/2023    8:09 AM 03/22/2023    3:47 PM  Depression screen PHQ 2/9  Decreased Interest 0 0 0 0 0  Down, Depressed, Hopeless 0 0 0 0 0  PHQ - 2 Score 0 0 0 0 0  Altered sleeping  0 0  0  Tired, decreased energy  0 0  0  Change in appetite  0 0  0  Feeling bad or failure about yourself   0 0  0  Trouble concentrating  0 0  0  Moving  slowly or fidgety/restless  0 0  0  Suicidal thoughts  0 0  0  PHQ-9 Score  0 0   0   Difficult doing work/chores  Not difficult at all Not difficult at all  Not difficult at all     Data saved with a previous flowsheet row definition    Hypertension:  BP Readings from Last 3 Encounters:  04/18/24 120/74  03/02/24 108/68  12/13/23 108/68    Obesity: Wt Readings from Last 3 Encounters:  04/18/24 183 lb 4.8 oz (83.1 kg)  03/02/24 185 lb (83.9 kg)  12/13/23 182 lb (82.6 kg)   BMI Readings from Last 3 Encounters:  04/18/24 25.57 kg/m  03/02/24 25.80 kg/m  12/13/23 25.38 kg/m     Flowsheet Row Clinical Support from 03/02/2024 in [facility]  AUDIT-C Score 1    Divorced STD testing and prevention (HIV/chl/gon/syphilis):  not applicable Sexual history: not active Hep C Screening: completed Skin cancer: Discussed monitoring for atypical lesions NA (Dermatology patient) Colorectal cancer: Cologuard negative in 2018, due Prostate cancer:  yes Lab Results  Component Value Date   PSA 0.12 03/22/2023   PSA 0.30 08/14/2020    Lung cancer:  Low Dose CT Chest recommended if Age 55-80 years, 30 pack-year currently smoking OR have quit  w/in 15years. Patient  is not a candidate for screening   AAA: The USPSTF recommends one-time screening with ultrasonography in men ages 65 to 75 years who have ever smoked. Patient   is not a candidate for screening  ECG:  01/15/2022  Vaccines: reviewed with the patient. Declines flu vaccine.   Advanced Care Planning: A voluntary discussion about advance care planning including the explanation and discussion of advance directives.  Discussed health care proxy and Living will, and the patient was able to identify a health care proxy.  Patient does not have a living will and power of attorney of health care   Patient Active Problem List   Diagnosis Date Noted   Umbilical hernia without obstruction and without gangrene     Incarcerated left inguinal hernia    Pure hypercholesterolemia 01/16/2019   Degenerative arthritis of cervical spine 02/23/2017   Benign prostatic hyperplasia    ED (erectile dysfunction)     Past Surgical History:  Procedure Laterality Date   CATARACT EXTRACTION W/PHACO Left 10/05/2023   Procedure: PHACOEMULSIFICATION, CATARACT, WITH IOL INSERTION 10.86 01:05.7;  Surgeon: Mittie Gaskin, MD;  Location: [facility];  Service: Ophthalmology;  Laterality: Left;   CATARACT EXTRACTION W/PHACO Right 10/19/2023   Procedure: PHACOEMULSIFICATION, CATARACT, WITH IOL INSERTION 7.18 00:49.4;  Surgeon: Mittie Gaskin, MD;  Location: [facility];  Service: Ophthalmology;  Laterality: Right;   HERNIA REPAIR Left    inguinal   INSERTION OF MESH  01/14/2022   Procedure: INSERTION OF MESH;  Surgeon: Desiderio Schanz, MD;  Location: [facility];  Service: General;;   SKIN CANCER EXCISION     removed from face   UMBILICAL HERNIA REPAIR N/A 01/14/2022   Procedure: HERNIA REPAIR UMBILICAL ADULT, open;  Surgeon: Desiderio Schanz, MD;  Location: [facility];  Service: General;  Laterality: N/A;    Family History  Problem Relation Age of Onset   Cancer Father    Alcohol abuse Father    Cancer Mother    Varicose Veins Paternal Grandmother     Social History   Socioeconomic History   Marital status: Divorced    Spouse name: Not on file   Number of children: 0   Years of education: Not on file   Highest education level: Some college, no degree  Occupational History   Occupation: retired  Tobacco Use   Smoking status: Never   Smokeless tobacco: Never  Vaping Use   Vaping status: Never Used  Substance and Sexual Activity   Alcohol use: No    Comment: Rare   Drug use: No   Sexual activity: Not Currently  Other Topics Concern   Not on file  Social History Narrative   Not on file   Social Drivers of Health   Tobacco Use: Low Risk (04/18/2024)   Patient History    Smoking  Tobacco Use: Never    Smokeless Tobacco Use: Never    Passive Exposure: Not on file  Financial Resource Strain: Low Risk (04/18/2024)   Overall Financial Resource Strain (CARDIA)    Difficulty of Paying Living Expenses: Not hard at all  Food Insecurity: No Food Insecurity (04/18/2024)   Epic    Worried About Radiation Protection Practitioner of Food in the Last Year: Never true    Ran Out of Food in the Last Year: Never true  Transportation Needs: No Transportation Needs (04/18/2024)   Epic    Lack of Transportation (Medical): No    Lack of Transportation (Non-Medical): No  Physical Activity: Sufficiently Active (  04/18/2024)   Exercise Vital Sign    Days of Exercise per Week: 7 days    Minutes of Exercise per Session: 30 min  Stress: No Stress Concern Present (04/18/2024)   [employer] of Occupational Health - Occupational Stress Questionnaire    Feeling of Stress: Not at all  Social Connections: Moderately Isolated (03/02/2024)   Social Connection and Isolation Panel    Frequency of Communication with Friends and Family: More than three times a week    Frequency of Social Gatherings with Friends and Family: More than three times a week    Attends Religious Services: More than 4 times per year    Active Member of Clubs or Organizations: No    Attends Banker Meetings: Never    Marital Status: Divorced  Catering Manager Violence: Not At Risk (04/18/2024)   Epic    Fear of Current or Ex-Partner: No    Emotionally Abused: No    Physically Abused: No    Sexually Abused: No  Depression (PHQ2-9): Low Risk (04/18/2024)   Depression (PHQ2-9)    PHQ-2 Score: 0  Alcohol Screen: Low Risk (03/02/2024)   Alcohol Screen    Last Alcohol Screening Score (AUDIT): 1  Housing: Unknown (04/18/2024)   Epic    Unable to Pay for Housing in the Last Year: No    Number of Times Moved in the Last Year: Not on file    Homeless in the Last Year: No  Utilities: Not At Risk (04/18/2024)   Epic     Threatened with loss of utilities: No  Health Literacy: Adequate Health Literacy (04/18/2024)   B1300 Health Literacy    Frequency of need for help with medical instructions: Never    Current Medications[1]  Allergies[2]   Review of Systems  Genitourinary:  Positive for frequency and urgency. Negative for dysuria, flank pain and hematuria.  Neurological:  Positive for tingling.    Objective  Vitals:   04/18/24 1005  BP: 120/74  Pulse: (!) 54  Resp: 16  Temp: 97.7 F (36.5 C)  TempSrc: Oral  SpO2: 97%  Weight: 183 lb 4.8 oz (83.1 kg)  Height: 5' 11 (1.803 m)    Body mass index is 25.57 kg/m.  Physical Exam Constitutional:      Appearance: Normal appearance.  HENT:     Head: Normocephalic and atraumatic.     Mouth/Throat:     Mouth: Mucous membranes are moist.     Pharynx: Oropharynx is clear.  Eyes:     Extraocular Movements: Extraocular movements intact.     Conjunctiva/sclera: Conjunctivae normal.     Pupils: Pupils are equal, round, and reactive to light.  Cardiovascular:     Rate and Rhythm: Normal rate and regular rhythm.  Pulmonary:     Effort: Pulmonary effort is normal.     Breath sounds: Normal breath sounds.  Musculoskeletal:     Right lower leg: No edema.     Left lower leg: No edema.  Skin:    General: Skin is warm and dry.  Neurological:     General: No focal deficit present.     Mental Status: He is alert. Mental status is at baseline.  Psychiatric:        Mood and Affect: Mood normal.        Behavior: Behavior normal.     Last CBC Lab Results  Component Value Date   WBC 5.4 12/13/2023   HGB 15.0 12/13/2023   HCT 44.1 12/13/2023  MCV 97.1 12/13/2023   MCH 33.0 12/13/2023   RDW 12.5 12/13/2023   PLT 259 12/13/2023   Last metabolic panel Lab Results  Component Value Date   GLUCOSE 90 12/13/2023   NA 140 12/13/2023   K 4.8 12/13/2023   CL 105 12/13/2023   CO2 30 12/13/2023   BUN 9 12/13/2023   CREATININE 0.98 12/13/2023    EGFR 81 12/13/2023   CALCIUM  9.3 12/13/2023   PROT 7.2 03/22/2023   ALBUMIN 4.2 01/05/2022   LABGLOB 2.6 01/29/2019   AGRATIO 1.6 01/29/2019   BILITOT 0.6 03/22/2023   ALKPHOS 79 01/05/2022   AST 16 03/22/2023   ALT 12 03/22/2023   ANIONGAP 13 01/05/2022   Last lipids Lab Results  Component Value Date   CHOL 188 03/22/2023   HDL 56 03/22/2023   LDLCALC 104 (H) 03/22/2023   TRIG 161 (H) 03/22/2023   CHOLHDL 3.4 03/22/2023   Last hemoglobin A1c No results found for: HGBA1C Last thyroid functions Lab Results  Component Value Date   TSH 0.80 12/13/2023   Last vitamin D  Lab Results  Component Value Date   VD25OH 52 12/13/2023   Last vitamin B12 and Folate Lab Results  Component Value Date   VITAMINB12 369 12/13/2023      Assessment & Plan  Assessment & Plan Annual Physical//Benign prostatic hyperplasia with lower urinary tract symptoms Chronic BPH with slow urinary stream. Considered adding tamsulosin to improve symptoms. Discussed Saw Palmetto as an alternative. - Ordered PSA test. - Refilled finasteride  prescription. - Provided information on tamsulosin. - Discussed Saw Palmetto.  Screening for malignant neoplasm of prostate PSA screening due. No urologist involved. - Ordered PSA test.  Screening for malignant neoplasm of colon Due for colon cancer screening. Discussed colonoscopy vs. Cologuard. He prefers Cologuard. - Ordered Cologuard test.  Lipid disorder screening Cholesterol screening due. Previous labs normal except PSA and cholesterol. - Ordered cholesterol test.  Peripheral neuropathy Intermittent numbness and tingling in feet, likely neuropathy. Symptoms manageable. - Encouraged wearing supportive shoes. - Encouraged maintaining physical activity.  General Health Maintenance Discussed flu vaccination and benefits of physical activity. - Offered flu vaccine. - Encouraged physical activity, such as cycling.   - Lipid Profile - PSA -  finasteride  (PROSCAR ) 5 MG tablet; Take 1 tablet (5 mg total) by mouth daily.  Dispense: 90 tablet; Refill: 3 - Cologuard - HgB A1c  -Prostate cancer screening and PSA options (with potential risks and benefits of testing vs not testing) were discussed along with recent recs/guidelines. -USPSTF grade A and B recommendations reviewed with patient; age-appropriate recommendations, preventive care, screening tests, etc discussed and encouraged; healthy living encouraged; see AVS for patient education given to patient -Discussed importance of 150 minutes of physical activity weekly, eat two servings of fish weekly, eat one serving of tree nuts ( cashews, pistachios, pecans, almonds.SABRA) every other day, eat 6 servings of fruit/vegetables daily and drink plenty of water and avoid sweet beverages.  -Reviewed Health Maintenance: yes      [1]  Current Outpatient Medications:    acetaminophen  (TYLENOL ) 500 MG tablet, Take 2 tablets (1,000 mg total) by mouth every 6 (six) hours as needed for mild pain., Disp: , Rfl:    Apple Cider Vinegar 300 MG TABS, Take by mouth., Disp: , Rfl:    finasteride  (PROSCAR ) 5 MG tablet, TAKE 1 TABLET BY MOUTH ONCE DAILY, Disp: 90 tablet, Rfl: 2   Multiple Vitamins-Minerals (ONE-A-DAY MENS 50+ ADVANTAGE) TABS, Take by mouth., Disp: , Rfl:  Omega-3 Fatty Acids (FISH OIL) 1000 MG CAPS, Take by mouth., Disp: , Rfl:    Turmeric 500 MG CAPS, Take by mouth., Disp: , Rfl:    Garlic 100 MG TABS, Take by mouth. (Patient not taking: Reported on 03/02/2024), Disp: , Rfl:  [2]  Allergies Allergen Reactions   Benadryl [Diphenhydramine] Other (See Comments)    Extreme drowsiness

## 2024-04-18 NOTE — Patient Instructions (Signed)
 Tamsulosin  Capsules What is this medication? TAMSULOSIN  (tam SOO loe sin) treats the symptoms of an enlarged prostate (benign prostatic hyperplasia). It works by relaxing the muscles in the prostate and bladder, which makes it easier to urinate. It belongs to a group of medications called alpha blockers. This medicine may be used for other purposes; ask your health care provider or pharmacist if you have questions. COMMON BRAND NAME(S): Flomax  What should I tell my care team before I take this medication? They need to know if you have any of the following conditions: Advanced kidney disease Advanced liver disease Low blood pressure Prostate cancer An unusual or allergic reaction to tamsulosin , sulfa  drugs, other medications, foods, dyes, or preservatives Pregnant or trying to get pregnant Breast-feeding How should I use this medication? Take this medication by mouth about 30 minutes after the same meal every day. Follow the directions on the prescription label. Swallow the capsules whole with a glass of water. Do not crush, chew, or open capsules. Do not take your medication more often than directed. Do not stop taking your medication unless your care team tells you to. Talk to your care team about the use of this medication in children. Special care may be needed. Overdosage: If you think you have taken too much of this medicine contact a poison control center or emergency room at once. NOTE: This medicine is only for you. Do not share this medicine with others. What if I miss a dose? If you miss a dose, take it as soon as you can. If it is almost time for your next dose, take only that dose. Do not take double or extra doses. If you stop taking your medication for several days or more, ask your care team what dose you should start back on. What may interact with this medication? Cimetidine Certain medications for erectile dysfunction, such as sildenafil, tadalafil,  vardenafil Fluoxetine Ketoconazole Medications for blood pressure Other alpha blockers, such as alfuzosin, doxazosin, phentolamine, phenoxybenzamine, prazosin, terazosin Warfarin This list may not describe all possible interactions. Give your health care provider a list of all the medicines, herbs, non-prescription drugs, or dietary supplements you use. Also tell them if you smoke, drink alcohol, or use illegal drugs. Some items may interact with your medicine. What should I watch for while using this medication? Visit your care team for regular checks on your progress. You will need blood work done before you start this medication and regularly while you are taking it. Check your blood pressure as directed. Know what your blood pressure should be and when to contact your care team. This medication may affect your coordination, reaction time, or judgment. Do not drive or operate machinery until you know how this medication affects you. Sit up or stand slowly to reduce the risk of dizzy or fainting spells. Drinking alcohol with this medication can increase the risk of these side effects. These effects may decrease once your body adjusts to the medication. Contact your care team right away if you have an erection that lasts longer than 4 hours or if it becomes painful. This may be a sign of a serious problem and must be treated right away to prevent permanent damage. If you are thinking of having cataract surgery, tell your eye surgeon that you have taken this medication. What side effects may I notice from receiving this medication? Side effects that you should report to your care team as soon as possible: Allergic reactions--skin rash, itching, hives, swelling of the face,  lips, tongue, or throat Low blood pressure--dizziness, feeling faint or lightheaded, blurry vision Prolonged or painful erection Side effects that usually do not require medical attention (report to your care team if they continue  or are bothersome): Change in sex drive or performance Dizziness Headache Runny or stuffy nose This list may not describe all possible side effects. Call your doctor for medical advice about side effects. You may report side effects to FDA at 1-800-FDA-1088. Where should I keep my medication? Keep out of the reach of children. Store at room temperature between 15 and 30 degrees C (59 and 86 degrees F). Throw away any unused medication after the expiration date. NOTE: This sheet is a summary. It may not cover all possible information. If you have questions about this medicine, talk to your doctor, pharmacist, or health care provider.  2024 Elsevier/Gold Standard (2021-11-05 00:00:00)

## 2024-04-19 LAB — HEMOGLOBIN A1C
Hgb A1c MFr Bld: 5.1 % (ref <5.7)
Mean Plasma Glucose: 100 mg/dL
eAG (mmol/L): 5.5 mmol/L

## 2024-04-19 LAB — LIPID PANEL
Cholesterol: 219 mg/dL — ABNORMAL HIGH (ref <200)
HDL: 69 mg/dL (ref >=40)
LDL Cholesterol (Calc): 135 mg/dL — ABNORMAL HIGH
Non-HDL Cholesterol (Calc): 150 mg/dL — ABNORMAL HIGH (ref <130)
Total CHOL/HDL Ratio: 3.2 (calc) (ref <5.0)
Triglycerides: 56 mg/dL (ref <150)

## 2024-04-19 LAB — PSA: PSA: 0.16 ng/mL (ref <=4.00)

## 2024-06-12 ENCOUNTER — Ambulatory Visit: Admitting: Dermatology

## 2025-02-06 ENCOUNTER — Ambulatory Visit: Admitting: Dermatology

## 2025-04-22 ENCOUNTER — Encounter: Admitting: Internal Medicine
